# Patient Record
Sex: Male | Born: 1950
Health system: Southern US, Community
[De-identification: ages and names within clinical notes are randomized; demographics above are authoritative.]

## PROBLEM LIST (undated history)

## (undated) DIAGNOSIS — I779 Disorder of arteries and arterioles, unspecified: Secondary | ICD-10-CM

## (undated) DIAGNOSIS — E785 Hyperlipidemia, unspecified: Secondary | ICD-10-CM

## (undated) DIAGNOSIS — I739 Peripheral vascular disease, unspecified: Secondary | ICD-10-CM

## (undated) DIAGNOSIS — N189 Chronic kidney disease, unspecified: Secondary | ICD-10-CM

## (undated) DIAGNOSIS — I10 Essential (primary) hypertension: Secondary | ICD-10-CM

## (undated) HISTORY — PX: COLONOSCOPY: SHX174

## (undated) HISTORY — PX: WISDOM TOOTH EXTRACTION: SHX21

---

## 2007-08-17 ENCOUNTER — Ambulatory Visit: Payer: Self-pay

## 2008-11-21 ENCOUNTER — Emergency Department: Payer: Self-pay | Admitting: Emergency Medicine

## 2013-04-13 ENCOUNTER — Emergency Department: Payer: Self-pay | Admitting: Emergency Medicine

## 2014-01-19 DIAGNOSIS — E785 Hyperlipidemia, unspecified: Secondary | ICD-10-CM | POA: Insufficient documentation

## 2014-02-03 DIAGNOSIS — I739 Peripheral vascular disease, unspecified: Secondary | ICD-10-CM | POA: Insufficient documentation

## 2015-08-13 DIAGNOSIS — Z0000 Encounter for general adult medical examination without abnormal findings: Secondary | ICD-10-CM | POA: Insufficient documentation

## 2015-09-29 DIAGNOSIS — L819 Disorder of pigmentation, unspecified: Secondary | ICD-10-CM | POA: Diagnosis not present

## 2015-09-29 DIAGNOSIS — L820 Inflamed seborrheic keratosis: Secondary | ICD-10-CM | POA: Diagnosis not present

## 2015-09-29 DIAGNOSIS — D229 Melanocytic nevi, unspecified: Secondary | ICD-10-CM | POA: Diagnosis not present

## 2015-09-29 DIAGNOSIS — L814 Other melanin hyperpigmentation: Secondary | ICD-10-CM | POA: Diagnosis not present

## 2015-09-29 DIAGNOSIS — L282 Other prurigo: Secondary | ICD-10-CM | POA: Diagnosis not present

## 2016-02-04 DIAGNOSIS — Z0000 Encounter for general adult medical examination without abnormal findings: Secondary | ICD-10-CM | POA: Diagnosis not present

## 2016-02-04 DIAGNOSIS — I739 Peripheral vascular disease, unspecified: Secondary | ICD-10-CM | POA: Diagnosis not present

## 2016-02-11 DIAGNOSIS — E7800 Pure hypercholesterolemia, unspecified: Secondary | ICD-10-CM | POA: Diagnosis not present

## 2016-02-11 DIAGNOSIS — I739 Peripheral vascular disease, unspecified: Secondary | ICD-10-CM | POA: Diagnosis not present

## 2016-02-18 DIAGNOSIS — H43313 Vitreous membranes and strands, bilateral: Secondary | ICD-10-CM | POA: Diagnosis not present

## 2016-04-13 DIAGNOSIS — H43313 Vitreous membranes and strands, bilateral: Secondary | ICD-10-CM | POA: Diagnosis not present

## 2016-04-13 DIAGNOSIS — H2513 Age-related nuclear cataract, bilateral: Secondary | ICD-10-CM | POA: Diagnosis not present

## 2016-08-10 DIAGNOSIS — I739 Peripheral vascular disease, unspecified: Secondary | ICD-10-CM | POA: Diagnosis not present

## 2016-08-10 DIAGNOSIS — Z0000 Encounter for general adult medical examination without abnormal findings: Secondary | ICD-10-CM | POA: Diagnosis not present

## 2016-08-10 DIAGNOSIS — E7800 Pure hypercholesterolemia, unspecified: Secondary | ICD-10-CM | POA: Diagnosis not present

## 2016-08-17 DIAGNOSIS — E7800 Pure hypercholesterolemia, unspecified: Secondary | ICD-10-CM | POA: Diagnosis not present

## 2016-08-17 DIAGNOSIS — I739 Peripheral vascular disease, unspecified: Secondary | ICD-10-CM | POA: Diagnosis not present

## 2016-08-17 DIAGNOSIS — Z0000 Encounter for general adult medical examination without abnormal findings: Secondary | ICD-10-CM | POA: Diagnosis not present

## 2016-09-27 DIAGNOSIS — K219 Gastro-esophageal reflux disease without esophagitis: Secondary | ICD-10-CM | POA: Diagnosis not present

## 2016-09-27 DIAGNOSIS — R490 Dysphonia: Secondary | ICD-10-CM | POA: Diagnosis not present

## 2016-09-27 DIAGNOSIS — H6121 Impacted cerumen, right ear: Secondary | ICD-10-CM | POA: Diagnosis not present

## 2016-09-27 DIAGNOSIS — J301 Allergic rhinitis due to pollen: Secondary | ICD-10-CM | POA: Diagnosis not present

## 2016-12-26 ENCOUNTER — Ambulatory Visit
Admission: RE | Admit: 2016-12-26 | Discharge: 2016-12-26 | Disposition: A | Discharge status: Home / Self Care | Payer: PPO | Source: Ambulatory Visit | Attending: Chiropractor | Admitting: Chiropractor

## 2016-12-26 ENCOUNTER — Other Ambulatory Visit: Payer: Self-pay | Admitting: Chiropractor

## 2016-12-26 DIAGNOSIS — R200 Anesthesia of skin: Secondary | ICD-10-CM

## 2016-12-26 DIAGNOSIS — R202 Paresthesia of skin: Principal | ICD-10-CM

## 2016-12-26 DIAGNOSIS — M47892 Other spondylosis, cervical region: Secondary | ICD-10-CM | POA: Insufficient documentation

## 2016-12-26 DIAGNOSIS — M542 Cervicalgia: Secondary | ICD-10-CM | POA: Diagnosis not present

## 2016-12-28 ENCOUNTER — Other Ambulatory Visit: Payer: Self-pay | Admitting: Internal Medicine

## 2016-12-28 DIAGNOSIS — G542 Cervical root disorders, not elsewhere classified: Secondary | ICD-10-CM | POA: Diagnosis not present

## 2017-01-10 ENCOUNTER — Ambulatory Visit
Admission: RE | Admit: 2017-01-10 | Discharge: 2017-01-10 | Disposition: A | Discharge status: Home / Self Care | Payer: PPO | Source: Ambulatory Visit | Attending: Internal Medicine | Admitting: Internal Medicine

## 2017-01-10 DIAGNOSIS — M50322 Other cervical disc degeneration at C5-C6 level: Secondary | ICD-10-CM | POA: Diagnosis not present

## 2017-01-10 DIAGNOSIS — M4802 Spinal stenosis, cervical region: Secondary | ICD-10-CM | POA: Diagnosis not present

## 2017-01-10 DIAGNOSIS — M2578 Osteophyte, vertebrae: Secondary | ICD-10-CM | POA: Diagnosis not present

## 2017-01-10 DIAGNOSIS — G452 Multiple and bilateral precerebral artery syndromes: Secondary | ICD-10-CM | POA: Insufficient documentation

## 2017-01-10 DIAGNOSIS — M50323 Other cervical disc degeneration at C6-C7 level: Secondary | ICD-10-CM | POA: Diagnosis not present

## 2017-01-10 DIAGNOSIS — G542 Cervical root disorders, not elsewhere classified: Secondary | ICD-10-CM

## 2017-01-17 DIAGNOSIS — M5412 Radiculopathy, cervical region: Secondary | ICD-10-CM | POA: Diagnosis not present

## 2017-01-19 DIAGNOSIS — M5412 Radiculopathy, cervical region: Secondary | ICD-10-CM | POA: Diagnosis not present

## 2017-01-23 DIAGNOSIS — M5412 Radiculopathy, cervical region: Secondary | ICD-10-CM | POA: Diagnosis not present

## 2017-01-25 DIAGNOSIS — M5412 Radiculopathy, cervical region: Secondary | ICD-10-CM | POA: Diagnosis not present

## 2017-01-30 DIAGNOSIS — M5412 Radiculopathy, cervical region: Secondary | ICD-10-CM | POA: Diagnosis not present

## 2017-02-07 DIAGNOSIS — E7800 Pure hypercholesterolemia, unspecified: Secondary | ICD-10-CM | POA: Diagnosis not present

## 2017-02-07 DIAGNOSIS — I739 Peripheral vascular disease, unspecified: Secondary | ICD-10-CM | POA: Diagnosis not present

## 2017-02-07 DIAGNOSIS — Z0000 Encounter for general adult medical examination without abnormal findings: Secondary | ICD-10-CM | POA: Diagnosis not present

## 2017-02-14 DIAGNOSIS — I739 Peripheral vascular disease, unspecified: Secondary | ICD-10-CM | POA: Diagnosis not present

## 2017-02-14 DIAGNOSIS — E7800 Pure hypercholesterolemia, unspecified: Secondary | ICD-10-CM | POA: Diagnosis not present

## 2017-02-17 DIAGNOSIS — M5412 Radiculopathy, cervical region: Secondary | ICD-10-CM | POA: Diagnosis not present

## 2017-02-28 DIAGNOSIS — M5030 Other cervical disc degeneration, unspecified cervical region: Secondary | ICD-10-CM | POA: Diagnosis not present

## 2017-02-28 DIAGNOSIS — M4802 Spinal stenosis, cervical region: Secondary | ICD-10-CM | POA: Diagnosis not present

## 2017-02-28 DIAGNOSIS — M5412 Radiculopathy, cervical region: Secondary | ICD-10-CM | POA: Diagnosis not present

## 2017-03-06 DIAGNOSIS — Z1283 Encounter for screening for malignant neoplasm of skin: Secondary | ICD-10-CM | POA: Diagnosis not present

## 2017-03-06 DIAGNOSIS — D229 Melanocytic nevi, unspecified: Secondary | ICD-10-CM | POA: Diagnosis not present

## 2017-03-06 DIAGNOSIS — L603 Nail dystrophy: Secondary | ICD-10-CM | POA: Diagnosis not present

## 2017-03-06 DIAGNOSIS — L812 Freckles: Secondary | ICD-10-CM | POA: Diagnosis not present

## 2017-03-06 DIAGNOSIS — L821 Other seborrheic keratosis: Secondary | ICD-10-CM | POA: Diagnosis not present

## 2017-03-06 DIAGNOSIS — L578 Other skin changes due to chronic exposure to nonionizing radiation: Secondary | ICD-10-CM | POA: Diagnosis not present

## 2017-05-04 DIAGNOSIS — H43313 Vitreous membranes and strands, bilateral: Secondary | ICD-10-CM | POA: Diagnosis not present

## 2017-05-04 DIAGNOSIS — H2513 Age-related nuclear cataract, bilateral: Secondary | ICD-10-CM | POA: Diagnosis not present

## 2017-08-14 DIAGNOSIS — I739 Peripheral vascular disease, unspecified: Secondary | ICD-10-CM | POA: Diagnosis not present

## 2017-08-14 DIAGNOSIS — E7800 Pure hypercholesterolemia, unspecified: Secondary | ICD-10-CM | POA: Diagnosis not present

## 2017-08-21 DIAGNOSIS — I739 Peripheral vascular disease, unspecified: Secondary | ICD-10-CM | POA: Diagnosis not present

## 2017-08-21 DIAGNOSIS — Z0000 Encounter for general adult medical examination without abnormal findings: Secondary | ICD-10-CM | POA: Diagnosis not present

## 2017-08-21 DIAGNOSIS — E7800 Pure hypercholesterolemia, unspecified: Secondary | ICD-10-CM | POA: Diagnosis not present

## 2017-12-14 DIAGNOSIS — R1032 Left lower quadrant pain: Secondary | ICD-10-CM | POA: Diagnosis not present

## 2018-02-06 IMAGING — MR MR CERVICAL SPINE W/O CM
5 series · 32 of 48 positions shown · non-contrast
Comparison: Prior radiographs from 12/26/2016.

CLINICAL DATA: Initial evaluation for intermittent left arm
tingling for 3-4 months, pain and left scapula.

EXAM:
MRI CERVICAL SPINE WITHOUT CONTRAST
TECHNIQUE: Multiplanar, multisequence MR imaging of the cervical spine was
performed. No intravenous contrast was administered.

[Series 4: T2 · sagittal · 3.0mm · 0.70mm/px · 6 of 13 slices shown (1 of 2)]
[im 1/13]
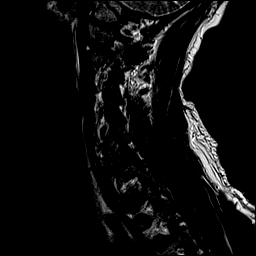
[im 3/13]
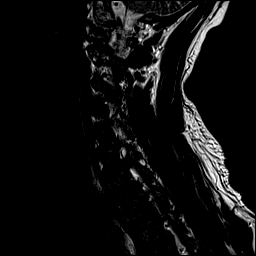
[im 5/13]
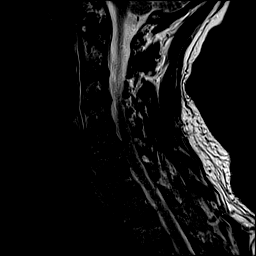
[im 8/13]
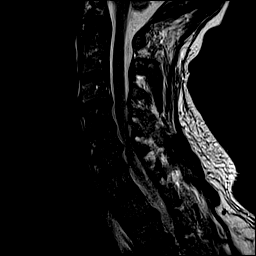
[im 10/13]
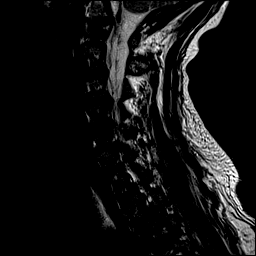
[im 13/13]
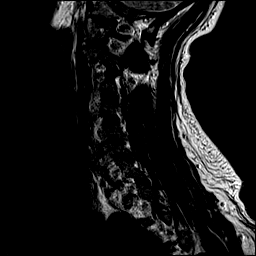

[Series 6: STIR · sagittal · 3.0mm · 0.35mm/px · 7 of 13 slices shown]
[im 1/13]
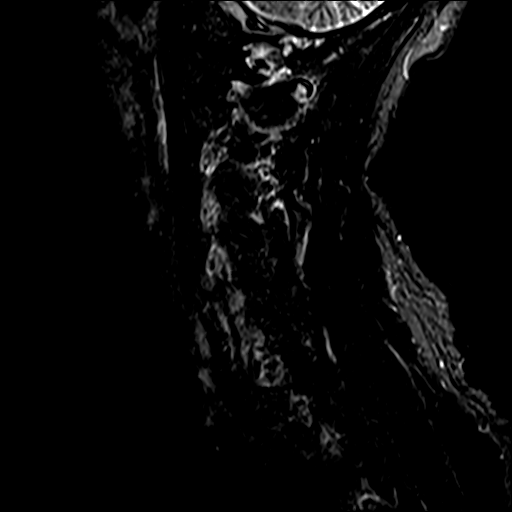
[im 3/13]
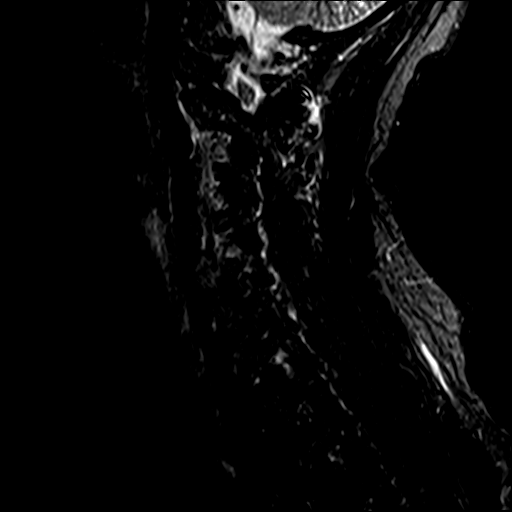
[im 5/13]
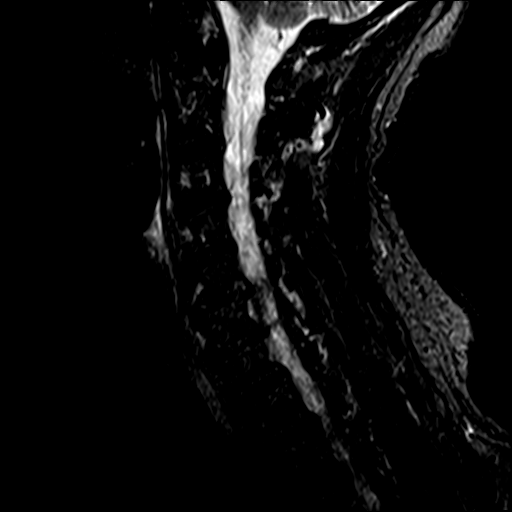
[im 7/13]
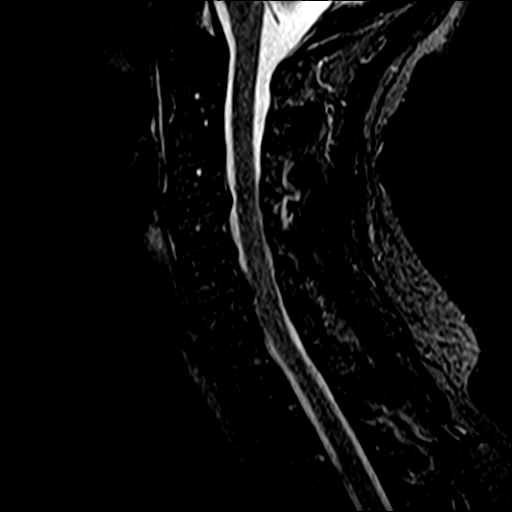
[im 9/13]
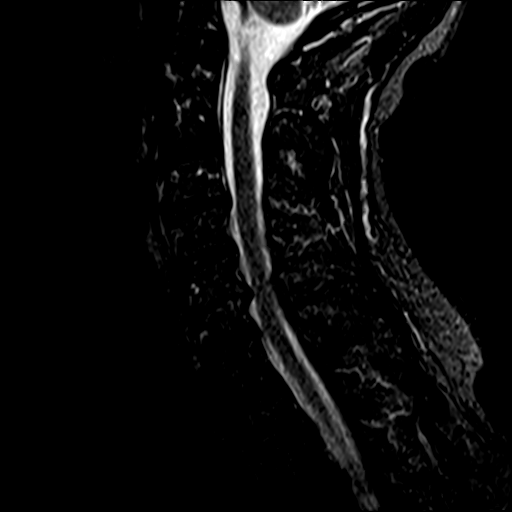
[im 11/13]
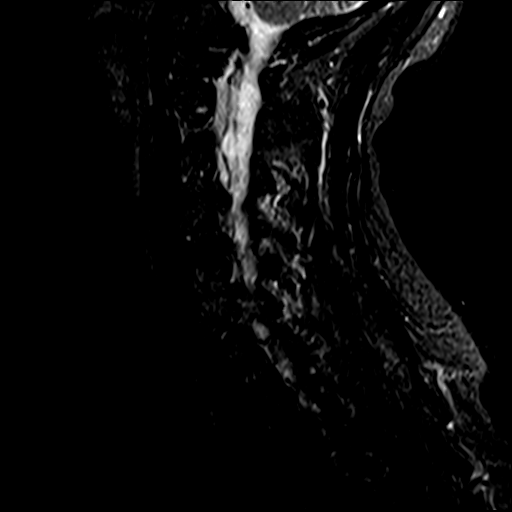
[im 13/13]
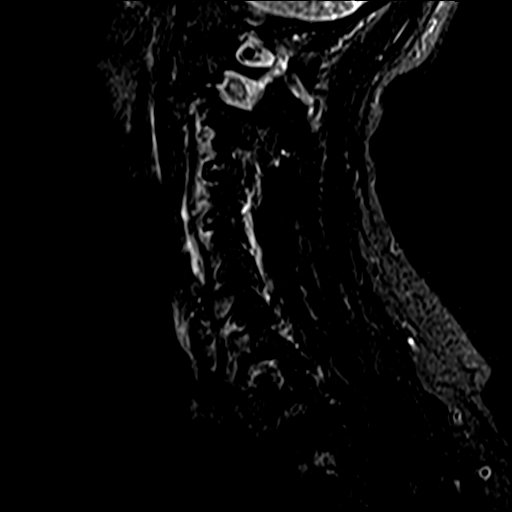

[Series 7: T2 · axial · 3.0mm · 0.70mm/px · z∈[-41,+51]mm · 8 of 26 slices shown (2 of 2)]
[im 1/26]
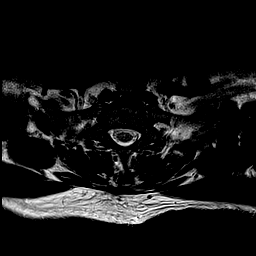
[im 4/26]
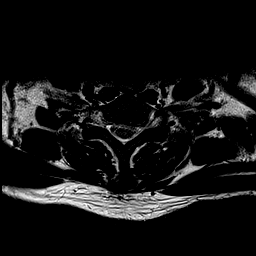
[im 8/26]
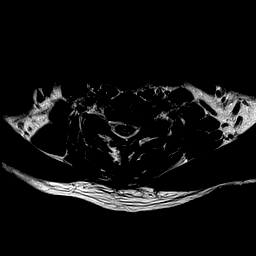
[im 12/26]
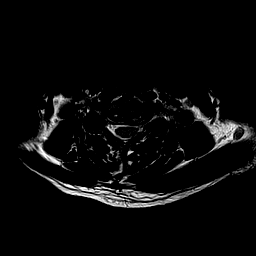
[im 14/26]
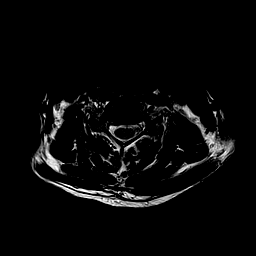
[im 18/26]
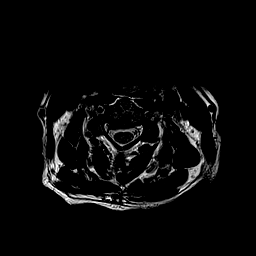
[im 22/26]
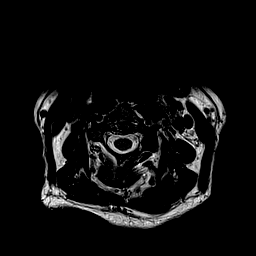
[im 26/26]
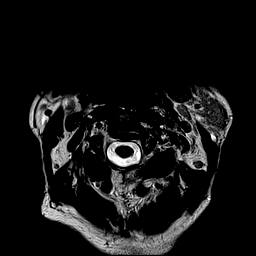

[Series 8: mpgr ax · axial · 3.0mm · 0.35mm/px · z∈[-41,-1]mm · 4 of 26 slices shown]
[im 1/26]
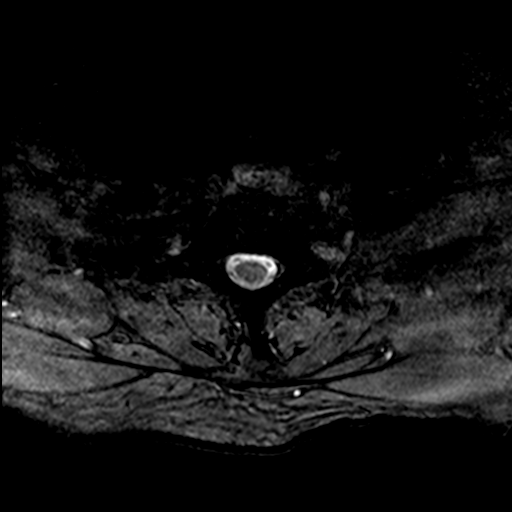
[im 4/26]
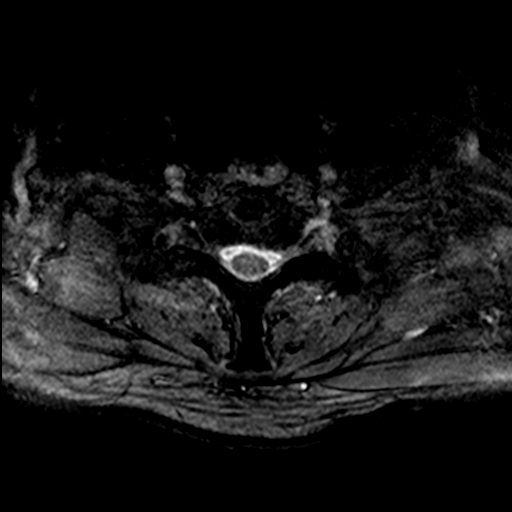
[im 8/26]
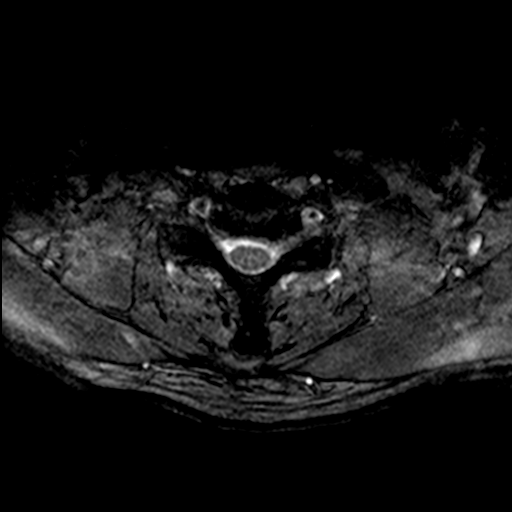
[im 12/26]
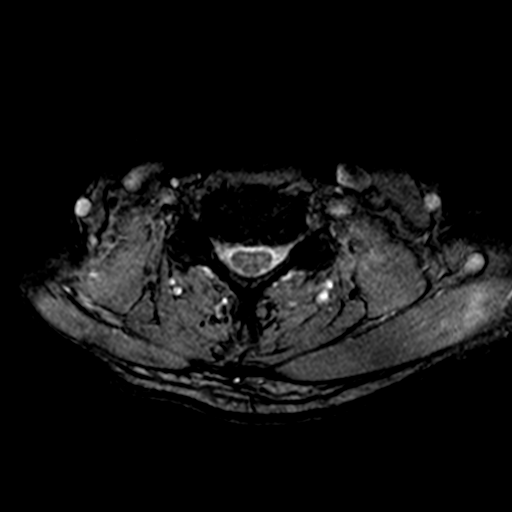

[Series 9: T1 · sagittal · 3.0mm · 0.70mm/px · 7 of 13 slices shown]
[im 1/13]
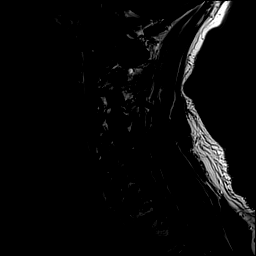
[im 3/13]
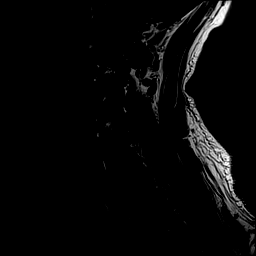
[im 5/13]
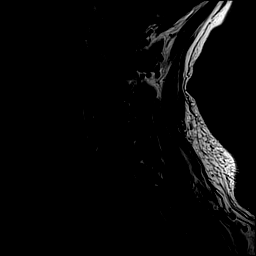
[im 7/13]
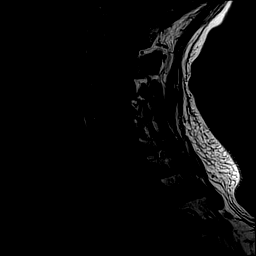
[im 9/13]
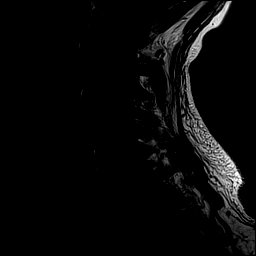
[im 11/13]
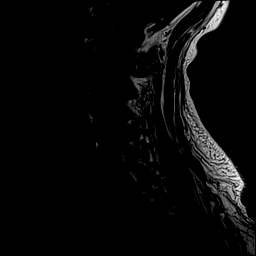
[im 13/13]
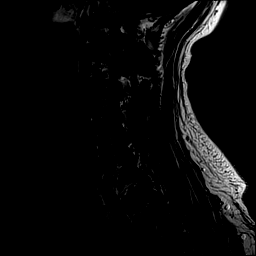

[32 of 48 positions shown; findings below may reference images not displayed]

FINDINGS: Alignment: Trace 2 mm retrolisthesis of C5 on C6. Vertebral bodies
otherwise normally aligned with preservation of the normal cervical
lordosis.

Vertebrae: Vertebral body heights are maintained. No evidence for
acute or chronic fracture. Signal intensity within the vertebral
body bone marrow is normal. No discrete or worrisome osseous
lesions. No abnormal marrow edema.

Cord: Signal intensity within the cervical spinal cord is normal.

Posterior Fossa, vertebral arteries, paraspinal tissues:
Craniocervical junction within normal limits. Paraspinous and
prevertebral soft tissues are normal. Normal intravascular flow
voids present within the vertebral arteries bilaterally.

Disc levels:

C2-C3: No significant disc bulge. Mild bilateral uncovertebral
hypertrophy. No significant canal or foraminal stenosis.

C3-C4: Diffuse degenerative disc bulge with bilateral uncovertebral
spurring. Mild bilateral facet hypertrophy. Broad posterior right
eccentric disc bulge flattens the ventral thecal sac. No significant
canal stenosis. Resultant moderate right with mild left C4 foraminal
stenosis.

C4-C5: Mild diffuse disc bulge. Right worse than left uncovertebral
spurring. Mild facet hypertrophy, greater on the left. Resultant
severe right with mild left C5 foraminal stenosis. Bulging disc
mildly flattens the ventral thecal sac without significant canal
stenosis.

C5-C6: Chronic diffuse degenerative disc osteophyte with
intervertebral disc space narrowing and uncovertebral spurring.
Broad posterior component flattens and partially faces the ventral
thecal sac. Mild cord flattening without cord signal changes.
Superimposed ligamentum flavum thickening. Resultant mild to
moderate canal stenosis. Severe bilateral C6 foraminal narrowing.

C6-C7: Chronic diffuse degenerative disc osteophyte with bilateral
uncovertebral spurring, worse on the left. Resultant more focal left
foraminal/far lateral disc osteophyte complex (series 7, image 19).
Superimposed mild facet hypertrophy. There is resultant severe left
C7 foraminal stenosis. Broad posterior disc osteophyte, slightly
centric to the left, flattens the ventral thecal sac without
significant canal stenosis. Mild right C7 foraminal narrowing.

C7-T1: Mild disc bulge with uncovertebral hypertrophy. Resultant
mild left C8 foraminal stenosis. No significant canal or right
foraminal narrowing.

Visualized upper thoracic spine unremarkable without significant
degenerative changes.
IMPRESSION: 1. Left-sided disc osteophyte complex at C6-7 with resultant severe
left C7 foraminal stenosis.
2. Chronic diffuse degenerative disc osteophyte at C5-6 with
resultant moderate canal and severe bilateral C6 foraminal stenosis.
3. Right-sided uncovertebral disease at C3-4 and C4-5 with resultant
moderate right C4 and severe right C5 foraminal stenosis.

## 2018-02-26 DIAGNOSIS — M25561 Pain in right knee: Secondary | ICD-10-CM | POA: Diagnosis not present

## 2018-03-07 DIAGNOSIS — D239 Other benign neoplasm of skin, unspecified: Secondary | ICD-10-CM

## 2018-03-07 DIAGNOSIS — D485 Neoplasm of uncertain behavior of skin: Secondary | ICD-10-CM | POA: Diagnosis not present

## 2018-03-07 DIAGNOSIS — L821 Other seborrheic keratosis: Secondary | ICD-10-CM | POA: Diagnosis not present

## 2018-03-07 DIAGNOSIS — L578 Other skin changes due to chronic exposure to nonionizing radiation: Secondary | ICD-10-CM | POA: Diagnosis not present

## 2018-03-07 DIAGNOSIS — D225 Melanocytic nevi of trunk: Secondary | ICD-10-CM | POA: Diagnosis not present

## 2018-03-07 DIAGNOSIS — Z1283 Encounter for screening for malignant neoplasm of skin: Secondary | ICD-10-CM | POA: Diagnosis not present

## 2018-03-07 DIAGNOSIS — L812 Freckles: Secondary | ICD-10-CM | POA: Diagnosis not present

## 2018-03-07 DIAGNOSIS — D2262 Melanocytic nevi of left upper limb, including shoulder: Secondary | ICD-10-CM | POA: Diagnosis not present

## 2018-03-07 HISTORY — DX: Other benign neoplasm of skin, unspecified: D23.9

## 2018-05-08 DIAGNOSIS — H43313 Vitreous membranes and strands, bilateral: Secondary | ICD-10-CM | POA: Diagnosis not present

## 2018-05-08 DIAGNOSIS — H2513 Age-related nuclear cataract, bilateral: Secondary | ICD-10-CM | POA: Diagnosis not present

## 2018-05-30 DIAGNOSIS — L0100 Impetigo, unspecified: Secondary | ICD-10-CM | POA: Diagnosis not present

## 2018-05-30 DIAGNOSIS — D2262 Melanocytic nevi of left upper limb, including shoulder: Secondary | ICD-10-CM | POA: Diagnosis not present

## 2018-05-30 DIAGNOSIS — L821 Other seborrheic keratosis: Secondary | ICD-10-CM | POA: Diagnosis not present

## 2018-05-30 DIAGNOSIS — R234 Changes in skin texture: Secondary | ICD-10-CM | POA: Diagnosis not present

## 2018-08-16 DIAGNOSIS — Z125 Encounter for screening for malignant neoplasm of prostate: Secondary | ICD-10-CM | POA: Diagnosis not present

## 2018-08-16 DIAGNOSIS — Z139 Encounter for screening, unspecified: Secondary | ICD-10-CM | POA: Diagnosis not present

## 2018-08-16 DIAGNOSIS — E7800 Pure hypercholesterolemia, unspecified: Secondary | ICD-10-CM | POA: Diagnosis not present

## 2018-08-23 DIAGNOSIS — N183 Chronic kidney disease, stage 3 unspecified: Secondary | ICD-10-CM | POA: Insufficient documentation

## 2018-08-23 DIAGNOSIS — I739 Peripheral vascular disease, unspecified: Secondary | ICD-10-CM | POA: Diagnosis not present

## 2018-08-23 DIAGNOSIS — E7800 Pure hypercholesterolemia, unspecified: Secondary | ICD-10-CM | POA: Diagnosis not present

## 2018-08-23 DIAGNOSIS — Z0000 Encounter for general adult medical examination without abnormal findings: Secondary | ICD-10-CM | POA: Diagnosis not present

## 2018-09-26 ENCOUNTER — Ambulatory Visit (INDEPENDENT_AMBULATORY_CARE_PROVIDER_SITE_OTHER): Payer: PPO

## 2018-09-26 ENCOUNTER — Ambulatory Visit: Payer: PPO | Admitting: Podiatry

## 2018-09-26 ENCOUNTER — Other Ambulatory Visit: Payer: Self-pay | Admitting: Podiatry

## 2018-09-26 ENCOUNTER — Encounter: Payer: Self-pay | Admitting: Podiatry

## 2018-09-26 DIAGNOSIS — M722 Plantar fascial fibromatosis: Secondary | ICD-10-CM

## 2018-09-26 DIAGNOSIS — L608 Other nail disorders: Secondary | ICD-10-CM | POA: Diagnosis not present

## 2018-09-26 DIAGNOSIS — L603 Nail dystrophy: Secondary | ICD-10-CM | POA: Diagnosis not present

## 2018-09-26 DIAGNOSIS — M779 Enthesopathy, unspecified: Principal | ICD-10-CM

## 2018-09-26 DIAGNOSIS — M778 Other enthesopathies, not elsewhere classified: Secondary | ICD-10-CM

## 2018-09-26 NOTE — Progress Notes (Signed)
  Subjective:  Patient ID: Jordan Leach., male    DOB: [birth year],  MRN: [id number] HPI Chief Complaint  Patient presents with  . Foot Pain    Plantar and posterior heel bilateral (R>L) - aching x couple weeks, AM pain, walks 3 miles/day, tried stretching-some better  . Nail Problem    Concerned about discoloration of toenails especially hallux nails  . New Patient (Initial Visit)    68 y.o. male presents with the above complaint.   ROS: He denies fever chills nausea vomiting muscle aches pains calf pain back pain chest pain shortness of breath.  No past medical history on file.   Current Outpatient Medications:  .  clopidogrel (PLAVIX) 75 MG tablet, TAKE ONE TABLET BY MOUTH ONCE A DAY, Disp: , Rfl:  .  pravastatin (PRAVACHOL) 40 MG tablet, TAKE 1 TABLET BY MOUTH ONCE A DAY, Disp: , Rfl:  .  pantoprazole (PROTONIX) 40 MG tablet, , Disp: , Rfl:   Allergies  Allergen Reactions  . Aspirin     Other reaction(s): Angioedema  . Sulfa Antibiotics Rash   Review of Systems Objective:  There were no vitals filed for this visit.  General: Well developed, nourished, in no acute distress, alert and oriented x3   Dermatological: Skin is warm, dry and supple bilateral. Nails x 10 are well maintained; remaining integument appears unremarkable at this time. There are no open sores, no preulcerative lesions, no rash or signs of infection present.  Thick yellow dystrophic possibly mycotic nail hallux right lesser degree hallux left  Vascular: Dorsalis Pedis artery and Posterior Tibial artery pedal pulses are 2/4 bilateral with immedate capillary fill time. Pedal hair growth present. No varicosities and no lower extremity edema present bilateral.   Neruologic: Grossly intact via light touch bilateral. Vibratory intact via tuning fork bilateral. Protective threshold with Semmes Wienstein monofilament intact to all pedal sites bilateral. Patellar and Achilles deep tendon reflexes 2+  bilateral. No Babinski or clonus noted bilateral.   Musculoskeletal: No gross boney pedal deformities bilateral. No pain, crepitus, or limitation noted with foot and ankle range of motion bilateral. Muscular strength 5/5 in all groups tested bilateral.  Pain on palpation medial calcaneal tubercle of the right heel.  Gait: Unassisted, Nonantalgic.    Radiographs:  Radiographs demonstrate soft tissue increase in density plantar fashion calcaneal insertion site of the right heel minimally so on the left heel Achilles appears to be normal no large spurs noted.  No acute findings.  Assessment & Plan:   Assessment: Plantar fasciitis right heel.  Nail dystrophy hallux right greater than left.  Plan: Discussed etiology pathology conservative surgical therapies at this point I injected the right heel today with 20 mg Kenalog 5 mg Marcaine point of maximal tenderness.  Discussed appropriate shoe gear stretching exercise ice therapy sugar modifications.  Samples of the nails were taken today to be sent for pathologic evaluation follow-up with him in 1 month     Max T. Firth, [place]

## 2018-10-03 DIAGNOSIS — L820 Inflamed seborrheic keratosis: Secondary | ICD-10-CM | POA: Diagnosis not present

## 2018-10-03 DIAGNOSIS — L821 Other seborrheic keratosis: Secondary | ICD-10-CM | POA: Diagnosis not present

## 2018-10-24 ENCOUNTER — Ambulatory Visit: Payer: PPO | Admitting: Podiatry

## 2018-10-24 ENCOUNTER — Encounter: Payer: Self-pay | Admitting: Podiatry

## 2018-10-24 ENCOUNTER — Other Ambulatory Visit: Payer: Self-pay

## 2018-10-24 DIAGNOSIS — L603 Nail dystrophy: Secondary | ICD-10-CM | POA: Diagnosis not present

## 2018-10-24 NOTE — Progress Notes (Signed)
Presents today for follow-up of her right heel pain.  States that had no pain anymore.  He is also here for his [place] results.  Objective: Vital signs stable alert and oriented x3 pathology report does demonstrate nail dystrophy.  He also has no reproducible pain on palpation medial [place] tubercle of the right heel.  Assessment 100% resolution of plantar fasciitis.  Nail dystrophy left foot.  Plan: Follow-up with me on as-needed basis.

## 2018-10-29 DIAGNOSIS — M5410 Radiculopathy, site unspecified: Secondary | ICD-10-CM | POA: Diagnosis not present

## 2018-10-29 DIAGNOSIS — F419 Anxiety disorder, unspecified: Secondary | ICD-10-CM | POA: Diagnosis not present

## 2018-10-29 DIAGNOSIS — H6122 Impacted cerumen, left ear: Secondary | ICD-10-CM | POA: Diagnosis not present

## 2018-12-24 DIAGNOSIS — H6062 Unspecified chronic otitis externa, left ear: Secondary | ICD-10-CM | POA: Diagnosis not present

## 2018-12-24 DIAGNOSIS — H6123 Impacted cerumen, bilateral: Secondary | ICD-10-CM | POA: Diagnosis not present

## 2018-12-28 DIAGNOSIS — R030 Elevated blood-pressure reading, without diagnosis of hypertension: Secondary | ICD-10-CM | POA: Diagnosis not present

## 2018-12-28 DIAGNOSIS — N183 Chronic kidney disease, stage 3 (moderate): Secondary | ICD-10-CM | POA: Diagnosis not present

## 2018-12-28 DIAGNOSIS — M5030 Other cervical disc degeneration, unspecified cervical region: Secondary | ICD-10-CM | POA: Diagnosis not present

## 2018-12-28 DIAGNOSIS — M5412 Radiculopathy, cervical region: Secondary | ICD-10-CM | POA: Diagnosis not present

## 2018-12-28 DIAGNOSIS — E785 Hyperlipidemia, unspecified: Secondary | ICD-10-CM | POA: Diagnosis not present

## 2019-01-01 DIAGNOSIS — R030 Elevated blood-pressure reading, without diagnosis of hypertension: Secondary | ICD-10-CM | POA: Diagnosis not present

## 2019-01-03 DIAGNOSIS — M50222 Other cervical disc displacement at C5-C6 level: Secondary | ICD-10-CM | POA: Diagnosis not present

## 2019-01-03 DIAGNOSIS — M531 Cervicobrachial syndrome: Secondary | ICD-10-CM | POA: Diagnosis not present

## 2019-01-03 DIAGNOSIS — M9901 Segmental and somatic dysfunction of cervical region: Secondary | ICD-10-CM | POA: Diagnosis not present

## 2019-01-08 DIAGNOSIS — N183 Chronic kidney disease, stage 3 (moderate): Secondary | ICD-10-CM | POA: Diagnosis not present

## 2019-01-08 DIAGNOSIS — M50222 Other cervical disc displacement at C5-C6 level: Secondary | ICD-10-CM | POA: Diagnosis not present

## 2019-01-08 DIAGNOSIS — R030 Elevated blood-pressure reading, without diagnosis of hypertension: Secondary | ICD-10-CM | POA: Diagnosis not present

## 2019-01-08 DIAGNOSIS — M9901 Segmental and somatic dysfunction of cervical region: Secondary | ICD-10-CM | POA: Diagnosis not present

## 2019-01-08 DIAGNOSIS — I739 Peripheral vascular disease, unspecified: Secondary | ICD-10-CM | POA: Diagnosis not present

## 2019-01-08 DIAGNOSIS — E7800 Pure hypercholesterolemia, unspecified: Secondary | ICD-10-CM | POA: Diagnosis not present

## 2019-01-08 DIAGNOSIS — M531 Cervicobrachial syndrome: Secondary | ICD-10-CM | POA: Diagnosis not present

## 2019-01-10 DIAGNOSIS — M50222 Other cervical disc displacement at C5-C6 level: Secondary | ICD-10-CM | POA: Diagnosis not present

## 2019-01-10 DIAGNOSIS — M9901 Segmental and somatic dysfunction of cervical region: Secondary | ICD-10-CM | POA: Diagnosis not present

## 2019-01-10 DIAGNOSIS — M531 Cervicobrachial syndrome: Secondary | ICD-10-CM | POA: Diagnosis not present

## 2019-01-14 DIAGNOSIS — M9901 Segmental and somatic dysfunction of cervical region: Secondary | ICD-10-CM | POA: Diagnosis not present

## 2019-01-14 DIAGNOSIS — M531 Cervicobrachial syndrome: Secondary | ICD-10-CM | POA: Diagnosis not present

## 2019-01-14 DIAGNOSIS — M50222 Other cervical disc displacement at C5-C6 level: Secondary | ICD-10-CM | POA: Diagnosis not present

## 2019-01-15 DIAGNOSIS — N183 Chronic kidney disease, stage 3 (moderate): Secondary | ICD-10-CM | POA: Diagnosis not present

## 2019-01-15 DIAGNOSIS — E7800 Pure hypercholesterolemia, unspecified: Secondary | ICD-10-CM | POA: Diagnosis not present

## 2019-01-15 DIAGNOSIS — I739 Peripheral vascular disease, unspecified: Secondary | ICD-10-CM | POA: Diagnosis not present

## 2019-01-17 DIAGNOSIS — M50222 Other cervical disc displacement at C5-C6 level: Secondary | ICD-10-CM | POA: Diagnosis not present

## 2019-01-17 DIAGNOSIS — M9901 Segmental and somatic dysfunction of cervical region: Secondary | ICD-10-CM | POA: Diagnosis not present

## 2019-01-17 DIAGNOSIS — M531 Cervicobrachial syndrome: Secondary | ICD-10-CM | POA: Diagnosis not present

## 2019-01-22 DIAGNOSIS — M50222 Other cervical disc displacement at C5-C6 level: Secondary | ICD-10-CM | POA: Diagnosis not present

## 2019-01-22 DIAGNOSIS — M531 Cervicobrachial syndrome: Secondary | ICD-10-CM | POA: Diagnosis not present

## 2019-01-22 DIAGNOSIS — M9901 Segmental and somatic dysfunction of cervical region: Secondary | ICD-10-CM | POA: Diagnosis not present

## 2019-01-30 DIAGNOSIS — I10 Essential (primary) hypertension: Secondary | ICD-10-CM | POA: Insufficient documentation

## 2019-03-01 DIAGNOSIS — I10 Essential (primary) hypertension: Secondary | ICD-10-CM | POA: Diagnosis not present

## 2019-03-01 DIAGNOSIS — N183 Chronic kidney disease, stage 3 (moderate): Secondary | ICD-10-CM | POA: Diagnosis not present

## 2019-03-12 DIAGNOSIS — E7800 Pure hypercholesterolemia, unspecified: Secondary | ICD-10-CM | POA: Diagnosis not present

## 2019-03-12 DIAGNOSIS — I739 Peripheral vascular disease, unspecified: Secondary | ICD-10-CM | POA: Diagnosis not present

## 2019-03-12 DIAGNOSIS — N183 Chronic kidney disease, stage 3 (moderate): Secondary | ICD-10-CM | POA: Diagnosis not present

## 2019-03-13 DIAGNOSIS — G4733 Obstructive sleep apnea (adult) (pediatric): Secondary | ICD-10-CM | POA: Diagnosis not present

## 2019-03-18 DIAGNOSIS — D2260 Melanocytic nevi of unspecified upper limb, including shoulder: Secondary | ICD-10-CM | POA: Diagnosis not present

## 2019-03-18 DIAGNOSIS — L578 Other skin changes due to chronic exposure to nonionizing radiation: Secondary | ICD-10-CM | POA: Diagnosis not present

## 2019-03-18 DIAGNOSIS — D2270 Melanocytic nevi of unspecified lower limb, including hip: Secondary | ICD-10-CM | POA: Diagnosis not present

## 2019-03-18 DIAGNOSIS — Z1283 Encounter for screening for malignant neoplasm of skin: Secondary | ICD-10-CM | POA: Diagnosis not present

## 2019-03-18 DIAGNOSIS — L814 Other melanin hyperpigmentation: Secondary | ICD-10-CM | POA: Diagnosis not present

## 2019-03-18 DIAGNOSIS — L821 Other seborrheic keratosis: Secondary | ICD-10-CM | POA: Diagnosis not present

## 2019-03-18 DIAGNOSIS — D2230 Melanocytic nevi of unspecified part of face: Secondary | ICD-10-CM | POA: Diagnosis not present

## 2019-03-18 DIAGNOSIS — D225 Melanocytic nevi of trunk: Secondary | ICD-10-CM | POA: Diagnosis not present

## 2019-03-18 DIAGNOSIS — R234 Changes in skin texture: Secondary | ICD-10-CM | POA: Diagnosis not present

## 2019-03-18 DIAGNOSIS — D1800 Hemangioma unspecified site: Secondary | ICD-10-CM | POA: Diagnosis not present

## 2019-06-04 DIAGNOSIS — Z125 Encounter for screening for malignant neoplasm of prostate: Secondary | ICD-10-CM | POA: Diagnosis not present

## 2019-06-04 DIAGNOSIS — I739 Peripheral vascular disease, unspecified: Secondary | ICD-10-CM | POA: Diagnosis not present

## 2019-06-04 DIAGNOSIS — H6123 Impacted cerumen, bilateral: Secondary | ICD-10-CM | POA: Diagnosis not present

## 2019-06-04 DIAGNOSIS — E7800 Pure hypercholesterolemia, unspecified: Secondary | ICD-10-CM | POA: Diagnosis not present

## 2019-06-04 DIAGNOSIS — N1831 Chronic kidney disease, stage 3a: Secondary | ICD-10-CM | POA: Diagnosis not present

## 2019-06-04 DIAGNOSIS — H6061 Unspecified chronic otitis externa, right ear: Secondary | ICD-10-CM | POA: Diagnosis not present

## 2019-06-04 DIAGNOSIS — I10 Essential (primary) hypertension: Secondary | ICD-10-CM | POA: Diagnosis not present

## 2019-06-14 DIAGNOSIS — Z20828 Contact with and (suspected) exposure to other viral communicable diseases: Secondary | ICD-10-CM | POA: Diagnosis not present

## 2019-08-28 DIAGNOSIS — Z125 Encounter for screening for malignant neoplasm of prostate: Secondary | ICD-10-CM | POA: Diagnosis not present

## 2019-08-28 DIAGNOSIS — E7800 Pure hypercholesterolemia, unspecified: Secondary | ICD-10-CM | POA: Diagnosis not present

## 2019-08-28 DIAGNOSIS — N1831 Chronic kidney disease, stage 3a: Secondary | ICD-10-CM | POA: Diagnosis not present

## 2019-08-28 DIAGNOSIS — I10 Essential (primary) hypertension: Secondary | ICD-10-CM | POA: Diagnosis not present

## 2019-09-04 DIAGNOSIS — N1831 Chronic kidney disease, stage 3a: Secondary | ICD-10-CM | POA: Diagnosis not present

## 2019-09-04 DIAGNOSIS — I10 Essential (primary) hypertension: Secondary | ICD-10-CM | POA: Diagnosis not present

## 2019-09-04 DIAGNOSIS — Z0000 Encounter for general adult medical examination without abnormal findings: Secondary | ICD-10-CM | POA: Diagnosis not present

## 2019-09-04 DIAGNOSIS — E7800 Pure hypercholesterolemia, unspecified: Secondary | ICD-10-CM | POA: Diagnosis not present

## 2019-09-04 DIAGNOSIS — I739 Peripheral vascular disease, unspecified: Secondary | ICD-10-CM | POA: Diagnosis not present

## 2019-09-27 DIAGNOSIS — L820 Inflamed seborrheic keratosis: Secondary | ICD-10-CM | POA: Diagnosis not present

## 2019-09-27 DIAGNOSIS — R21 Rash and other nonspecific skin eruption: Secondary | ICD-10-CM | POA: Diagnosis not present

## 2019-09-27 DIAGNOSIS — L249 Irritant contact dermatitis, unspecified cause: Secondary | ICD-10-CM | POA: Diagnosis not present

## 2019-10-21 DIAGNOSIS — D485 Neoplasm of uncertain behavior of skin: Secondary | ICD-10-CM | POA: Diagnosis not present

## 2019-10-21 DIAGNOSIS — L578 Other skin changes due to chronic exposure to nonionizing radiation: Secondary | ICD-10-CM | POA: Diagnosis not present

## 2019-10-21 DIAGNOSIS — L570 Actinic keratosis: Secondary | ICD-10-CM

## 2019-10-21 HISTORY — DX: Actinic keratosis: L57.0

## 2019-11-22 DIAGNOSIS — H61031 Chondritis of right external ear: Secondary | ICD-10-CM | POA: Diagnosis not present

## 2019-11-22 DIAGNOSIS — H6123 Impacted cerumen, bilateral: Secondary | ICD-10-CM | POA: Diagnosis not present

## 2019-11-22 DIAGNOSIS — H6063 Unspecified chronic otitis externa, bilateral: Secondary | ICD-10-CM | POA: Diagnosis not present

## 2019-12-03 ENCOUNTER — Ambulatory Visit: Payer: PPO | Admitting: Dermatology

## 2019-12-03 ENCOUNTER — Other Ambulatory Visit: Payer: Self-pay

## 2019-12-03 DIAGNOSIS — L570 Actinic keratosis: Secondary | ICD-10-CM | POA: Diagnosis not present

## 2019-12-03 DIAGNOSIS — L578 Other skin changes due to chronic exposure to nonionizing radiation: Secondary | ICD-10-CM

## 2019-12-03 DIAGNOSIS — H61002 Unspecified perichondritis of left external ear: Secondary | ICD-10-CM

## 2019-12-03 DIAGNOSIS — L821 Other seborrheic keratosis: Secondary | ICD-10-CM | POA: Diagnosis not present

## 2019-12-03 DIAGNOSIS — H61001 Unspecified perichondritis of right external ear: Secondary | ICD-10-CM

## 2019-12-03 DIAGNOSIS — D229 Melanocytic nevi, unspecified: Secondary | ICD-10-CM | POA: Diagnosis not present

## 2019-12-03 DIAGNOSIS — I781 Nevus, non-neoplastic: Secondary | ICD-10-CM

## 2019-12-03 NOTE — Progress Notes (Signed)
   Follow-Up Visit   Subjective  Jordan Leach. is a 69 y.o. male who presents for the following: Actinic Keratosis (bx proven - above the R earlobe adjacent to the sup anti tragus, patient is here today for treatment), check spot (L ear), and check moles (back). He also has a spot on the right ear that he would like checked that the ENT felt was [facility]  The following portions of the chart were reviewed this encounter and updated as appropriate: Tobacco  Allergies  Meds  Problems  Med Hx  Surg Hx  Fam Hx     Review of Systems: No other skin or systemic complaints.  Objective  Well appearing patient in no apparent distress; mood and affect are within normal limits.  A focused examination was performed including the face. Relevant physical exam findings are noted in the Assessment and Plan.  Objective  above the right earlobe adjacent to the sup anti tragus: Erythematous thin papules/macules with gritty scale.   Objective  R helix: Scaly pink pap  Objective  L concha: Telangiectatic patch  Assessment & Plan  AK (actinic keratosis) above the right earlobe adjacent to the sup anti tragus  Bx proven -   Destruction of lesion - above the right earlobe adjacent to the sup anti tragus Complexity: simple   Destruction method: cryotherapy   Informed consent: discussed and consent obtained   Timeout:  patient name, date of birth, surgical site, and procedure verified Lesion destroyed using liquid nitrogen: Yes   Region frozen until ice ball extended beyond lesion: Yes   Outcome: patient tolerated procedure well with no complications   Post-procedure details: wound care instructions given    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 R helix  Destruction of lesion - R helix Complexity: simple   Destruction method: cryotherapy   Informed consent: discussed and consent obtained   Timeout:  patient name, date of birth, surgical site, and procedure  verified Lesion destroyed using liquid nitrogen: Yes   Region frozen until ice ball extended beyond lesion: Yes   Outcome: patient tolerated procedure well with no complications   Post-procedure details: wound care instructions given    Telangiectasia L concha  Benign, observe   Actinic Damage - diffuse scaly erythematous macules with underlying dyspigmentation - Recommend daily broad spectrum sunscreen SPF 30+ to sun-exposed areas, reapply every 2 hours as needed.  - Call for new or changing lesions. Melanocytic Nevi - Tan-brown and/or pink-flesh-colored symmetric macules and papules - Benign appearing on exam today - Observation - Call clinic for new or changing moles - Recommend daily use of broad spectrum spf 30+ sunscreen to sun-exposed areas.   Seborrheic Keratoses - Stuck-on, waxy, tan-brown papules and plaques  - Discussed benign etiology and prognosis. - Observe - Call for any changes       Return in 3 months (on 03/03/2020) for recheck AK and CNCH R ear, TBSE.   Luther Redo, CMA, am acting as scribe for Sarina Ser, MD . I, Othelia Pulling, RMA, am acting as scribe for Sarina Ser, MD .  Documentation: I have reviewed the above documentation for accuracy and completeness, and I agree with the above.  Sarina Ser, MD

## 2019-12-04 ENCOUNTER — Encounter: Payer: Self-pay | Admitting: Dermatology

## 2020-02-25 DIAGNOSIS — E7800 Pure hypercholesterolemia, unspecified: Secondary | ICD-10-CM | POA: Diagnosis not present

## 2020-02-25 DIAGNOSIS — I10 Essential (primary) hypertension: Secondary | ICD-10-CM | POA: Diagnosis not present

## 2020-02-25 DIAGNOSIS — N1831 Chronic kidney disease, stage 3a: Secondary | ICD-10-CM | POA: Diagnosis not present

## 2020-03-03 DIAGNOSIS — E7800 Pure hypercholesterolemia, unspecified: Secondary | ICD-10-CM | POA: Diagnosis not present

## 2020-03-03 DIAGNOSIS — N1831 Chronic kidney disease, stage 3a: Secondary | ICD-10-CM | POA: Diagnosis not present

## 2020-03-03 DIAGNOSIS — I10 Essential (primary) hypertension: Secondary | ICD-10-CM | POA: Diagnosis not present

## 2020-03-04 ENCOUNTER — Other Ambulatory Visit: Payer: Self-pay

## 2020-03-04 ENCOUNTER — Ambulatory Visit: Payer: PPO | Admitting: Dermatology

## 2020-03-04 DIAGNOSIS — L821 Other seborrheic keratosis: Secondary | ICD-10-CM

## 2020-03-04 DIAGNOSIS — D1800 Hemangioma unspecified site: Secondary | ICD-10-CM | POA: Diagnosis not present

## 2020-03-04 DIAGNOSIS — L570 Actinic keratosis: Secondary | ICD-10-CM

## 2020-03-04 DIAGNOSIS — D485 Neoplasm of uncertain behavior of skin: Secondary | ICD-10-CM

## 2020-03-04 DIAGNOSIS — L814 Other melanin hyperpigmentation: Secondary | ICD-10-CM

## 2020-03-04 DIAGNOSIS — L739 Follicular disorder, unspecified: Secondary | ICD-10-CM | POA: Diagnosis not present

## 2020-03-04 DIAGNOSIS — L578 Other skin changes due to chronic exposure to nonionizing radiation: Secondary | ICD-10-CM

## 2020-03-04 DIAGNOSIS — D229 Melanocytic nevi, unspecified: Secondary | ICD-10-CM

## 2020-03-04 DIAGNOSIS — Z1283 Encounter for screening for malignant neoplasm of skin: Secondary | ICD-10-CM | POA: Diagnosis not present

## 2020-03-04 DIAGNOSIS — H61001 Unspecified perichondritis of right external ear: Secondary | ICD-10-CM

## 2020-03-04 NOTE — Patient Instructions (Signed)

## 2020-03-04 NOTE — Progress Notes (Signed)
Follow-Up Visit   Subjective  Jordan Leach. is a 69 y.o. male who presents for the following: Annual Exam (recheck AK's of the ears ). The patient presents for Total-Body Skin Exam (TBSE) for skin cancer screening and mole check.  The following portions of the chart were reviewed this encounter and updated as appropriate:  Tobacco  Allergies  Meds  Problems  Med Hx  Surg Hx  Fam Hx     Review of Systems:  No other skin or systemic complaints except as noted in HPI or Assessment and Plan.  Objective  Well appearing patient in no apparent distress; mood and affect are within normal limits.  A full examination was performed including scalp, head, eyes, ears, nose, lips, neck, chest, axillae, abdomen, back, buttocks, bilateral upper extremities, bilateral lower extremities, hands, feet, fingers, toes, fingernails, and toenails. All findings within normal limits unless otherwise noted below.  Objective  R ear lobe: 0.8 cm scaly pink patch      Objective  above the right earlobe adjacent to the sup anti tragus: Clear   Objective  R mid to inf anti helix: Erythema and scale  Images    Assessment & Plan    Neoplasm of uncertain behavior of skin R ear lobe  Skin / nail biopsy Type of biopsy: tangential   Informed consent: discussed and consent obtained   Timeout: patient name, date of birth, surgical site, and procedure verified   Procedure prep:  Patient was prepped and draped in usual sterile fashion Prep type:  Isopropyl alcohol Anesthesia: the lesion was anesthetized in a standard fashion   Anesthetic:  1% lidocaine w/ epinephrine 1-100,000 buffered w/ 8.4% NaHCO3 Instrument used: flexible razor blade   Hemostasis achieved with: pressure, aluminum chloride and electrodesiccation   Outcome: patient tolerated procedure well   Post-procedure details: sterile dressing applied and wound care instructions given   Dressing type: bandage and petrolatum     Specimen 1 - Surgical pathology Differential Diagnosis: CNCH vs AK vs CA Check Margins: No 0.8 cm scaly pink patch  AK (actinic keratosis) above the right earlobe adjacent to the sup anti tragus Bx proven - resolved and clear  Clear. Observe for recurrence. Call clinic for new or changing lesions.  Recommend regular skin exams, daily broad-spectrum spf 30+ sunscreen use, and photoprotection.    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  Observation.  Call clinic for new or changing moles.  Recommend daily use of broad spectrum spf 30+ sunscreen to sun-exposed areas.  May persist.  Consider treatment or biopsy if worsens.    Lentigines - Scattered tan macules - Discussed due to sun exposure - Benign, observe - Call for any changes  Seborrheic Keratoses - Stuck-on, waxy, tan-brown papules and plaques  - Discussed benign etiology and prognosis. - Observe - Call for any changes  Melanocytic Nevi - Tan-brown and/or pink-flesh-colored symmetric macules and papules - Benign appearing on exam today - Observation - Call clinic for new or changing moles - Recommend daily use of broad spectrum spf 30+ sunscreen to sun-exposed areas.   Hemangiomas - Red papules - Discussed benign nature - Observe - Call for any changes  Actinic Damage - diffuse scaly erythematous macules with underlying dyspigmentation - Recommend daily broad spectrum sunscreen SPF 30+ to sun-exposed areas, reapply every 2 hours as needed.  - Call for new or changing lesions.  Skin cancer screening performed today.  Return in about 1 year (  around 03/04/2021) for TBSE.  Luther Redo, CMA, am acting as scribe for Sarina Ser, MD .  Documentation: I have reviewed the above documentation for accuracy and completeness, and I agree with the above.  Sarina Ser, MD

## 2020-03-07 ENCOUNTER — Encounter: Payer: Self-pay | Admitting: Dermatology

## 2020-03-09 ENCOUNTER — Telehealth: Payer: Self-pay

## 2020-03-09 NOTE — Telephone Encounter (Signed)
-----   Message from Ralene Bathe, MD sent at 03/07/2020  3:42 PM EDT ----- Skin , right ear lobe RUPTURED FOLLICULITIS  Benign inflamed follicle Not further treatment at this time Recheck next visit

## 2020-03-09 NOTE — Telephone Encounter (Signed)
Patient informed of pathology results 

## 2020-03-19 ENCOUNTER — Encounter: Payer: PPO | Admitting: Dermatology

## 2020-05-29 DIAGNOSIS — H6063 Unspecified chronic otitis externa, bilateral: Secondary | ICD-10-CM | POA: Diagnosis not present

## 2020-05-29 DIAGNOSIS — H6123 Impacted cerumen, bilateral: Secondary | ICD-10-CM | POA: Diagnosis not present

## 2020-05-29 DIAGNOSIS — H61031 Chondritis of right external ear: Secondary | ICD-10-CM | POA: Diagnosis not present

## 2020-06-11 DIAGNOSIS — H43313 Vitreous membranes and strands, bilateral: Secondary | ICD-10-CM | POA: Diagnosis not present

## 2020-06-11 DIAGNOSIS — H2513 Age-related nuclear cataract, bilateral: Secondary | ICD-10-CM | POA: Diagnosis not present

## 2020-08-17 ENCOUNTER — Ambulatory Visit (INDEPENDENT_AMBULATORY_CARE_PROVIDER_SITE_OTHER): Payer: PPO

## 2020-08-17 ENCOUNTER — Other Ambulatory Visit: Payer: Self-pay

## 2020-08-17 ENCOUNTER — Encounter: Payer: Self-pay | Admitting: Podiatry

## 2020-08-17 ENCOUNTER — Ambulatory Visit: Payer: PPO | Admitting: Podiatry

## 2020-08-17 DIAGNOSIS — M7671 Peroneal tendinitis, right leg: Secondary | ICD-10-CM

## 2020-08-17 DIAGNOSIS — M7751 Other enthesopathy of right foot: Secondary | ICD-10-CM

## 2020-08-17 MED ORDER — METHYLPREDNISOLONE 4 MG PO TBPK: ORAL_TABLET | ORAL | 0 refills | Status: DC

## 2020-08-17 NOTE — Progress Notes (Signed)
He presents today chief concern of pain to the fifth metatarsal base of the right foot.  States that he is an avid exercise individual runs and walks a lot.  He says is been aching for the past 3 to 4 weeks says it comes and goes really no treatment.  States that he notices some difference with different shoes.  Denies any trauma.  Objective: Vital signs are stable alert oriented x3 pulses are palpable.  Neurologic sensorium is intact butyric flexors are intact muscle strength is normal and symmetrical.  Has tenderness on palpation of the base of the fifth metatarsal right foot at the insertion site of the peroneal brevis tendon.  There is an area of fluctuance within this tendon sheath.  Radiographs taken today demonstrate an osseously mature individual with what appears to be a very small stress avulsion of the most proximal portion of the fifth metatarsal base.  There is also some soft tissue swelling in the area consistent with bursitis or insertional tendinitis.  Assessment: Insertional peroneal tendinitis.  Plan: Discussed etiology pathology conservative surgical therapies.  At this point I injected 2 mg of dexamethasone to the point of maximal tenderness.  Tolerated procedure well without complications.  Also put him on methylprednisolone and recommended Voltaren gel.  I will follow-up with him in 1 month if necessary.

## 2020-08-24 ENCOUNTER — Other Ambulatory Visit: Payer: Self-pay | Admitting: Podiatry

## 2020-08-24 DIAGNOSIS — M7671 Peroneal tendinitis, right leg: Secondary | ICD-10-CM

## 2020-08-28 DIAGNOSIS — Z125 Encounter for screening for malignant neoplasm of prostate: Secondary | ICD-10-CM | POA: Diagnosis not present

## 2020-08-28 DIAGNOSIS — I10 Essential (primary) hypertension: Secondary | ICD-10-CM | POA: Diagnosis not present

## 2020-08-28 DIAGNOSIS — N1831 Chronic kidney disease, stage 3a: Secondary | ICD-10-CM | POA: Diagnosis not present

## 2020-08-28 DIAGNOSIS — E7800 Pure hypercholesterolemia, unspecified: Secondary | ICD-10-CM | POA: Diagnosis not present

## 2020-09-28 ENCOUNTER — Encounter: Payer: Self-pay | Admitting: Podiatry

## 2020-09-28 ENCOUNTER — Other Ambulatory Visit: Payer: Self-pay

## 2020-09-28 ENCOUNTER — Ambulatory Visit: Payer: PPO | Admitting: Podiatry

## 2020-09-28 DIAGNOSIS — M7671 Peroneal tendinitis, right leg: Secondary | ICD-10-CM | POA: Diagnosis not present

## 2020-09-28 NOTE — Progress Notes (Signed)
He presents today states that is been doing great as he refers to his peroneal tendinitis the flared up last Friday I did place Voltaren gel on it and it is completely well again.  He states his been doing fantastic with no pain.  Objective: Vital signs are stable he is alert oriented x3.  There is no erythema edema cellulitis drainage odor no pain on palpation.  Assessment: Well-healing peroneal tendinitis right.  Plan: Follow-up with me on an as-needed basis.

## 2020-10-16 DIAGNOSIS — E7800 Pure hypercholesterolemia, unspecified: Secondary | ICD-10-CM | POA: Diagnosis not present

## 2020-10-16 DIAGNOSIS — Z0000 Encounter for general adult medical examination without abnormal findings: Secondary | ICD-10-CM | POA: Diagnosis not present

## 2020-10-16 DIAGNOSIS — I739 Peripheral vascular disease, unspecified: Secondary | ICD-10-CM | POA: Diagnosis not present

## 2020-10-16 DIAGNOSIS — I10 Essential (primary) hypertension: Secondary | ICD-10-CM | POA: Diagnosis not present

## 2020-10-16 DIAGNOSIS — N1831 Chronic kidney disease, stage 3a: Secondary | ICD-10-CM | POA: Diagnosis not present

## 2020-10-16 DIAGNOSIS — H93A3 Pulsatile tinnitus, bilateral: Secondary | ICD-10-CM | POA: Diagnosis not present

## 2020-11-02 DIAGNOSIS — H10412 Chronic giant papillary conjunctivitis, left eye: Secondary | ICD-10-CM | POA: Diagnosis not present

## 2020-12-01 DIAGNOSIS — H93A3 Pulsatile tinnitus, bilateral: Secondary | ICD-10-CM | POA: Diagnosis not present

## 2020-12-01 DIAGNOSIS — I6523 Occlusion and stenosis of bilateral carotid arteries: Secondary | ICD-10-CM | POA: Diagnosis not present

## 2020-12-04 DIAGNOSIS — I6521 Occlusion and stenosis of right carotid artery: Secondary | ICD-10-CM | POA: Insufficient documentation

## 2020-12-04 DIAGNOSIS — I779 Disorder of arteries and arterioles, unspecified: Secondary | ICD-10-CM | POA: Insufficient documentation

## 2020-12-22 ENCOUNTER — Other Ambulatory Visit (INDEPENDENT_AMBULATORY_CARE_PROVIDER_SITE_OTHER): Payer: Self-pay | Admitting: Vascular Surgery

## 2020-12-22 DIAGNOSIS — I6521 Occlusion and stenosis of right carotid artery: Secondary | ICD-10-CM

## 2020-12-25 ENCOUNTER — Encounter (INDEPENDENT_AMBULATORY_CARE_PROVIDER_SITE_OTHER): Payer: PPO

## 2020-12-25 ENCOUNTER — Encounter (INDEPENDENT_AMBULATORY_CARE_PROVIDER_SITE_OTHER): Payer: PPO | Admitting: Vascular Surgery

## 2020-12-25 ENCOUNTER — Ambulatory Visit (INDEPENDENT_AMBULATORY_CARE_PROVIDER_SITE_OTHER): Payer: PPO

## 2020-12-25 ENCOUNTER — Encounter (INDEPENDENT_AMBULATORY_CARE_PROVIDER_SITE_OTHER): Payer: Self-pay | Admitting: Vascular Surgery

## 2020-12-25 ENCOUNTER — Other Ambulatory Visit: Payer: Self-pay

## 2020-12-25 ENCOUNTER — Encounter (INDEPENDENT_AMBULATORY_CARE_PROVIDER_SITE_OTHER): Payer: Self-pay

## 2020-12-25 ENCOUNTER — Ambulatory Visit (INDEPENDENT_AMBULATORY_CARE_PROVIDER_SITE_OTHER): Payer: PPO | Admitting: Vascular Surgery

## 2020-12-25 VITALS — BP 159/79 | HR 60 | Resp 16 | Ht 72.0 in | Wt 176.0 lb

## 2020-12-25 DIAGNOSIS — I10 Essential (primary) hypertension: Secondary | ICD-10-CM

## 2020-12-25 DIAGNOSIS — E785 Hyperlipidemia, unspecified: Secondary | ICD-10-CM | POA: Diagnosis not present

## 2020-12-25 DIAGNOSIS — I6521 Occlusion and stenosis of right carotid artery: Secondary | ICD-10-CM | POA: Diagnosis not present

## 2020-12-25 NOTE — Progress Notes (Signed)
MRN : [id number]  Jordan Leach. is a 70 y.o. (March 22, 1951) male who presents with chief complaint of  Chief Complaint  Patient presents with  . New Patient (Initial Visit)    Ref Anderson occlusion and stenosis of right carotid artery  .  History of Present Illness: Patient is referred from Dr. Ouida Sills for evaluation of carotid artery stenosis.  The patient has pulsatile tinnitus after having had tinnitus for many years.  This prompted an ultrasound which I have reviewed which was interpreted as 50 to 69% right ICA stenosis and 1 to 49% left ICA stenosis by duplex criteria. He denies focal neurologic deficits. Specifically, the patient denies amaurosis fugax, speech or swallowing difficulties, or arm or leg weakness or numbness   Current Outpatient Medications  Medication Sig Dispense Refill  . carvedilol (COREG) 25 MG tablet Take 25 mg by mouth 2 (two) times daily.    . clopidogrel (PLAVIX) 75 MG tablet TAKE ONE TABLET BY MOUTH ONCE A DAY    . diazepam (VALIUM) 2 MG tablet Take 2 mg by mouth daily.    Marland Kitchen EPINEPHrine (EPIPEN IJ) Inject as directed as needed.    . pantoprazole (PROTONIX) 40 MG tablet     . rosuvastatin (CRESTOR) 40 MG tablet Take 40 mg by mouth daily.    Marland Kitchen AFLURIA QUADRIVALENT 0.5 ML injection  (Patient not taking: Reported on 12/25/2020)    . methylPREDNISolone (MEDROL DOSEPAK) 4 MG TBPK tablet 6 day dose pack - take as directed (Patient not taking: Reported on 12/25/2020) 21 tablet 0  . pravastatin (PRAVACHOL) 40 MG tablet TAKE 1 TABLET BY MOUTH ONCE A DAY (Patient not taking: Reported on 12/25/2020)     No current facility-administered medications for this visit.    Past Medical History:  Diagnosis Date  . Actinic keratosis 10/21/2019   above the R earlobe adjacent to the sup anti tragus (bx proven)  . Dysplastic nevus 03/07/2018   L post shoulder    Past Surgical History:  Procedure Laterality Date  . WISDOM TOOTH EXTRACTION       Social History    Tobacco Use  . Smoking status: Never Smoker  . Smokeless tobacco: Never Used  Substance Use Topics  . Alcohol use: Yes  . Drug use: Never      Family History  Problem Relation Age of Onset  . Dementia Mother   . Stroke Father   no bleeding or clotting disorders  Allergies  Allergen Reactions  . Aspirin     Other reaction(s): Angioedema  . Sulfa Antibiotics Rash     REVIEW OF SYSTEMS (Negative unless checked)  Constitutional: [] Weight loss  [] Fever  [] Chills Cardiac: [] Chest pain   [] Chest pressure   [] Palpitations   [] Shortness of breath when laying flat   [] Shortness of breath at rest   [] Shortness of breath with exertion. Vascular:  [] Pain in legs with walking   [] Pain in legs at rest   [] Pain in legs when laying flat   [] Claudication   [] Pain in feet when walking  [] Pain in feet at rest  [] Pain in feet when laying flat   [] History of DVT   [] Phlebitis   [] Swelling in legs   [] Varicose veins   [] Non-healing ulcers Pulmonary:   [] Uses home oxygen   [] Productive cough   [] Hemoptysis   [] Wheeze  [] COPD   [] Asthma Neurologic:  [] Dizziness  [] Blackouts   [] Seizures   [] History of stroke   [] History of TIA  [] Aphasia   []   Temporary blindness   [] Dysphagia   [] Weakness or numbness in arms   [] Weakness or numbness in legs Musculoskeletal:  [] Arthritis   [] Joint swelling   [] Joint pain   [] Low back pain Hematologic:  [] Easy bruising  [] Easy bleeding   [] Hypercoagulable state   [] Anemic  [] Hepatitis Gastrointestinal:  [] Blood in stool   [] Vomiting blood  [] Gastroesophageal reflux/heartburn   [] Difficulty swallowing. Genitourinary:  [x] Chronic kidney disease   [] Difficult urination  [] Frequent urination  [] Burning with urination   [] Blood in urine Skin:  [] Rashes   [] Ulcers   [] Wounds Psychological:  [] History of anxiety   []  History of major depression.  Physical Examination  Vitals:   12/25/20 1112  BP: (!) 159/79  Pulse: 60  Resp: 16  Weight: 176 lb (79.8 kg)  Height: 6'  (1.829 m)   Body mass index is 23.87 kg/m. Gen:  WD/WN, NAD. Appears younger than stated age. Head: [place]/AT, No temporalis wasting. Ear/Nose/Throat: Hearing grossly intact, nares w/o erythema or drainage, trachea midline Eyes: Conjunctiva clear. Sclera non-icteric Neck: Supple.  Soft right carotid bruit  Pulmonary:  Good air movement, equal and clear to auscultation bilaterally.  Cardiac: RRR, No JVD Vascular:  Vessel Right Left  Radial Palpable Palpable       Musculoskeletal: M/S 5/5 throughout.  No deformity or atrophy. No edema. Neurologic: CN 2-12 intact. Sensation grossly intact in extremities.  Symmetrical.  Speech is fluent. Motor exam as listed above. Psychiatric: Judgment intact, Mood & affect appropriate for pt's clinical situation. Dermatologic: No rashes or ulcers noted.  No cellulitis or open wounds.      CBC No results found for: WBC, HGB, HCT, MCV, PLT  BMET No results found for: NA, K, CL, CO2, GLUCOSE, BUN, CREATININE, CALCIUM, GFRNONAA, GFRAA CrCl cannot be calculated (No successful lab value found.).  COAG No results found for: INR, PROTIME  Radiology No results found.   Assessment/Plan Hypertension, essential blood pressure control important in reducing the progression of atherosclerotic disease. On appropriate oral medications.   Hyperlipidemia lipid control important in reducing the progression of atherosclerotic disease. Continue statin therapy   Carotid stenosis, asymptomatic, right This prompted an ultrasound which I have reviewed which was interpreted as 50 to 69% right ICA stenosis and 1 to 49% left ICA stenosis by duplex criteria. Continue Plavix and Crestor. No role for intervention at this level. Recheck in six months.     Leotis Pain, MD  12/25/2020 12:45 PM    This note was created with Dragon medical transcription system.  Any errors from dictation are purely unintentional

## 2020-12-25 NOTE — Assessment & Plan Note (Signed)
blood pressure control important in reducing the progression of atherosclerotic disease. On appropriate oral medications.  

## 2020-12-25 NOTE — Assessment & Plan Note (Signed)
This prompted an ultrasound which I have reviewed which was interpreted as 50 to 69% right ICA stenosis and 1 to 49% left ICA stenosis by duplex criteria. Continue Plavix and Crestor. No role for intervention at this level. Recheck in six months.

## 2020-12-25 NOTE — Assessment & Plan Note (Signed)
lipid control important in reducing the progression of atherosclerotic disease. Continue statin therapy  

## 2021-02-26 DIAGNOSIS — H6123 Impacted cerumen, bilateral: Secondary | ICD-10-CM | POA: Diagnosis not present

## 2021-02-26 DIAGNOSIS — K219 Gastro-esophageal reflux disease without esophagitis: Secondary | ICD-10-CM | POA: Diagnosis not present

## 2021-02-26 DIAGNOSIS — H6063 Unspecified chronic otitis externa, bilateral: Secondary | ICD-10-CM | POA: Diagnosis not present

## 2021-02-26 DIAGNOSIS — H61031 Chondritis of right external ear: Secondary | ICD-10-CM | POA: Diagnosis not present

## 2021-03-08 DIAGNOSIS — E7800 Pure hypercholesterolemia, unspecified: Secondary | ICD-10-CM | POA: Diagnosis not present

## 2021-03-11 ENCOUNTER — Other Ambulatory Visit: Payer: Self-pay

## 2021-03-11 ENCOUNTER — Ambulatory Visit: Payer: PPO | Admitting: Dermatology

## 2021-03-11 DIAGNOSIS — D229 Melanocytic nevi, unspecified: Secondary | ICD-10-CM

## 2021-03-11 DIAGNOSIS — L603 Nail dystrophy: Secondary | ICD-10-CM | POA: Diagnosis not present

## 2021-03-11 DIAGNOSIS — L814 Other melanin hyperpigmentation: Secondary | ICD-10-CM | POA: Diagnosis not present

## 2021-03-11 DIAGNOSIS — L821 Other seborrheic keratosis: Secondary | ICD-10-CM | POA: Diagnosis not present

## 2021-03-11 DIAGNOSIS — Z86018 Personal history of other benign neoplasm: Secondary | ICD-10-CM

## 2021-03-11 DIAGNOSIS — Z1283 Encounter for screening for malignant neoplasm of skin: Secondary | ICD-10-CM | POA: Diagnosis not present

## 2021-03-11 DIAGNOSIS — L578 Other skin changes due to chronic exposure to nonionizing radiation: Secondary | ICD-10-CM | POA: Diagnosis not present

## 2021-03-11 DIAGNOSIS — D1800 Hemangioma unspecified site: Secondary | ICD-10-CM | POA: Diagnosis not present

## 2021-03-11 NOTE — Patient Instructions (Signed)

## 2021-03-11 NOTE — Progress Notes (Signed)
   Follow-Up Visit   Subjective  Jordan Leach. is a 70 y.o. male who presents for the following: Annual Exam (Hx dysplastic nevus and AK's ). The patient presents for Total-Body Skin Exam (TBSE) for skin cancer screening and mole check.  The following portions of the chart were reviewed this encounter and updated as appropriate:   Tobacco  Allergies  Meds  Problems  Med Hx  Surg Hx  Fam Hx     Review of Systems:  No other skin or systemic complaints except as noted in HPI or Assessment and Plan.  Objective  Well appearing patient in no apparent distress; mood and affect are within normal limits.  A full examination was performed including scalp, head, eyes, ears, nose, lips, neck, chest, axillae, abdomen, back, buttocks, bilateral upper extremities, bilateral lower extremities, hands, feet, fingers, toes, fingernails, and toenails. All findings within normal limits unless otherwise noted below.   Assessment & Plan  Nail dystrophy B/L toenails Likely due to trauma and pressure from frequent walking. Patient states that Dr. Milinda Pointer did a culture which was negative for tinea.  No treatment at this time.  Will observe.  Watch for change.  Lentigines - Scattered tan macules - Due to sun exposure - Benign-appering, observe - Recommend daily broad spectrum sunscreen SPF 30+ to sun-exposed areas, reapply every 2 hours as needed. - Call for any changes  Seborrheic Keratoses - Stuck-on, waxy, tan-brown papules and/or plaques  - Benign-appearing - Discussed benign etiology and prognosis. - Observe - Call for any changes  Melanocytic Nevi - Tan-brown and/or pink-flesh-colored symmetric macules and papules - Benign appearing on exam today - Observation - Call clinic for new or changing moles - Recommend daily use of broad spectrum spf 30+ sunscreen to sun-exposed areas.   Hemangiomas - Red papules - Discussed benign nature - Observe - Call for any changes  Actinic  Damage - Chronic condition, secondary to cumulative UV/sun exposure - diffuse scaly erythematous macules with underlying dyspigmentation - Recommend daily broad spectrum sunscreen SPF 30+ to sun-exposed areas, reapply every 2 hours as needed.  - Staying in the shade or wearing long sleeves, sun glasses (UVA+UVB protection) and wide brim hats (4-inch brim around the entire circumference of the hat) are also recommended for sun protection.  - Call for new or changing lesions.  History of Dysplastic Nevi - No evidence of recurrence today - Recommend regular full body skin exams - Recommend daily broad spectrum sunscreen SPF 30+ to sun-exposed areas, reapply every 2 hours as needed.  - Call if any new or changing lesions are noted between office visits  Skin cancer screening performed today.  Return in about 1 year (around 03/11/2022) for TBSE.  Luther Redo, CMA, am acting as scribe for Sarina Ser, MD . Documentation: I have reviewed the above documentation for accuracy and completeness, and I agree with the above.  Sarina Ser, MD

## 2021-03-16 ENCOUNTER — Encounter: Payer: Self-pay | Admitting: Dermatology

## 2021-04-15 DIAGNOSIS — I10 Essential (primary) hypertension: Secondary | ICD-10-CM | POA: Diagnosis not present

## 2021-04-15 DIAGNOSIS — E7800 Pure hypercholesterolemia, unspecified: Secondary | ICD-10-CM | POA: Diagnosis not present

## 2021-04-15 DIAGNOSIS — N1831 Chronic kidney disease, stage 3a: Secondary | ICD-10-CM | POA: Diagnosis not present

## 2021-04-22 DIAGNOSIS — N1831 Chronic kidney disease, stage 3a: Secondary | ICD-10-CM | POA: Diagnosis not present

## 2021-04-22 DIAGNOSIS — E7800 Pure hypercholesterolemia, unspecified: Secondary | ICD-10-CM | POA: Diagnosis not present

## 2021-04-22 DIAGNOSIS — I779 Disorder of arteries and arterioles, unspecified: Secondary | ICD-10-CM | POA: Diagnosis not present

## 2021-04-22 DIAGNOSIS — I739 Peripheral vascular disease, unspecified: Secondary | ICD-10-CM | POA: Diagnosis not present

## 2021-04-22 DIAGNOSIS — I10 Essential (primary) hypertension: Secondary | ICD-10-CM | POA: Diagnosis not present

## 2021-04-22 DIAGNOSIS — Z23 Encounter for immunization: Secondary | ICD-10-CM | POA: Diagnosis not present

## 2021-06-16 ENCOUNTER — Ambulatory Visit: Payer: PPO | Admitting: Podiatry

## 2021-07-02 ENCOUNTER — Other Ambulatory Visit: Payer: Self-pay

## 2021-07-02 ENCOUNTER — Ambulatory Visit (INDEPENDENT_AMBULATORY_CARE_PROVIDER_SITE_OTHER): Payer: PPO

## 2021-07-02 ENCOUNTER — Ambulatory Visit (INDEPENDENT_AMBULATORY_CARE_PROVIDER_SITE_OTHER): Payer: PPO | Admitting: Vascular Surgery

## 2021-07-02 VITALS — BP 151/88 | HR 56 | Ht 72.0 in | Wt 178.0 lb

## 2021-07-02 DIAGNOSIS — N183 Chronic kidney disease, stage 3 unspecified: Secondary | ICD-10-CM | POA: Diagnosis not present

## 2021-07-02 DIAGNOSIS — E785 Hyperlipidemia, unspecified: Secondary | ICD-10-CM | POA: Diagnosis not present

## 2021-07-02 DIAGNOSIS — I6521 Occlusion and stenosis of right carotid artery: Secondary | ICD-10-CM

## 2021-07-02 DIAGNOSIS — I10 Essential (primary) hypertension: Secondary | ICD-10-CM | POA: Diagnosis not present

## 2021-07-02 DIAGNOSIS — I6523 Occlusion and stenosis of bilateral carotid arteries: Secondary | ICD-10-CM

## 2021-07-02 NOTE — Progress Notes (Signed)
MRN : [id number]  Jordan Colt. is a 70 y.o. ([birth year]) male who presents with chief complaint of  Chief Complaint  Patient presents with   Follow-up    6 mo Carotid  .  History of Present Illness: Patient returns in follow-up of his carotid disease.  He is doing well.  He had no complaints or problems since last visit 6 months ago.  He denies focal neurologic symptoms. Specifically, the patient denies amaurosis fugax, speech or swallowing difficulties, or arm or leg weakness or numbness. Duplex today shows 1 to 39% ICA stenosis bilaterally.  The velocities do not appear as high as the previous outside study.  Current Outpatient Medications  Medication Sig Dispense Refill   AFLURIA QUADRIVALENT 0.5 ML injection      carvedilol (COREG) 25 MG tablet Take 25 mg by mouth 2 (two) times daily.     clopidogrel (PLAVIX) 75 MG tablet TAKE ONE TABLET BY MOUTH ONCE A DAY     diazepam (VALIUM) 2 MG tablet Take 2 mg by mouth daily.     EPINEPHrine (EPIPEN IJ) Inject as directed as needed.     methylPREDNISolone (MEDROL DOSEPAK) 4 MG TBPK tablet 6 day dose pack - take as directed 21 tablet 0   pantoprazole (PROTONIX) 40 MG tablet      pravastatin (PRAVACHOL) 40 MG tablet TAKE 1 TABLET BY MOUTH ONCE A DAY     rosuvastatin (CRESTOR) 40 MG tablet Take 40 mg by mouth daily.     No current facility-administered medications for this visit.    Past Medical History:  Diagnosis Date   Actinic keratosis 10/21/2019   above the R earlobe adjacent to the sup anti tragus (bx proven)   Dysplastic nevus 03/07/2018   L post shoulder    Past Surgical History:  Procedure Laterality Date   WISDOM TOOTH EXTRACTION       Social History   Tobacco Use   Smoking status: Never   Smokeless tobacco: Never  Substance Use Topics   Alcohol use: Yes   Drug use: Never       Family History  Problem Relation Age of Onset   Dementia Mother    Stroke Father      Allergies  Allergen Reactions    Aspirin     Other reaction(s): Angioedema   Sulfa Antibiotics Rash     REVIEW OF SYSTEMS (Negative unless checked)  Constitutional: [] Weight loss  [] Fever  [] Chills Cardiac: [] Chest pain   [] Chest pressure   [] Palpitations   [] Shortness of breath when laying flat   [] Shortness of breath at rest   [] Shortness of breath with exertion. Vascular:  [] Pain in legs with walking   [] Pain in legs at rest   [] Pain in legs when laying flat   [] Claudication   [] Pain in feet when walking  [] Pain in feet at rest  [] Pain in feet when laying flat   [] History of DVT   [] Phlebitis   [] Swelling in legs   [] Varicose veins   [] Non-healing ulcers Pulmonary:   [] Uses home oxygen   [] Productive cough   [] Hemoptysis   [] Wheeze  [] COPD   [] Asthma Neurologic:  [] Dizziness  [] Blackouts   [] Seizures   [] History of stroke   [] History of TIA  [] Aphasia   [] Temporary blindness   [] Dysphagia   [] Weakness or numbness in arms   [] Weakness or numbness in legs Musculoskeletal:  [] Arthritis   [] Joint swelling   [] Joint pain   [] Low back pain Hematologic:  []   Easy bruising  [] Easy bleeding   [] Hypercoagulable state   [] Anemic  [] Hepatitis Gastrointestinal:  [] Blood in stool   [] Vomiting blood  [] Gastroesophageal reflux/heartburn   [] Difficulty swallowing. Genitourinary:  [x] Chronic kidney disease   [] Difficult urination  [] Frequent urination  [] Burning with urination   [] Blood in urine Skin:  [] Rashes   [] Ulcers   [] Wounds Psychological:  [] History of anxiety   []  History of major depression.  Physical Examination  Vitals:   07/02/21 1025  BP: (!) 151/88  Pulse: (!) 56  Weight: 178 lb (80.7 kg)  Height: 6' (1.829 m)   Body mass index is 24.14 kg/m. Gen:  WD/WN, NAD.  Appears younger than stated age Head: [place]/AT, No temporalis wasting. Ear/Nose/Throat: Hearing grossly intact, nares w/o erythema or drainage, trachea midline Eyes: Conjunctiva clear. Sclera non-icteric Neck: Supple.  No bruit  Pulmonary:  Good air movement,  equal and clear to auscultation bilaterally.  Cardiac: RRR, No JVD Vascular:  Vessel Right Left  Radial Palpable Palpable       Musculoskeletal: M/S 5/5 throughout.  No deformity or atrophy.  No edema. Neurologic: CN 2-12 intact. Sensation grossly intact in extremities.  Symmetrical.  Speech is fluent. Motor exam as listed above. Psychiatric: Judgment intact, Mood & affect appropriate for pt's clinical situation. Dermatologic: No rashes or ulcers noted.  No cellulitis or open wounds.     CBC No results found for: WBC, HGB, HCT, MCV, PLT  BMET No results found for: NA, K, CL, CO2, GLUCOSE, BUN, CREATININE, CALCIUM, GFRNONAA, GFRAA CrCl cannot be calculated (No successful lab value found.).  COAG No results found for: INR, PROTIME  Radiology No results found.   Assessment/Plan Hypertension, essential blood pressure control important in reducing the progression of atherosclerotic disease. On appropriate oral medications.     Hyperlipidemia lipid control important in reducing the progression of atherosclerotic disease. Continue statin therapy  Chronic kidney disease (CKD), stage III (moderate) (HCC) Avoid contrast unless necessary for higher degree of stenosis.  Carotid artery disease (HCC) Duplex today shows 1 to 39% ICA stenosis bilaterally.  The velocities do not appear as high as the previous outside study.  At this point, we will follow this on an annual basis with duplex.  He will continue Plavix and his statin agent.  He will contact our office with problems in the interim.    Leotis Pain, MD  07/02/2021 11:05 AM    This note was created with Dragon medical transcription system.  Any errors from dictation are purely unintentional

## 2021-07-02 NOTE — Assessment & Plan Note (Signed)
Duplex today shows 1 to 39% ICA stenosis bilaterally.  The velocities do not appear as high as the previous outside study.  At this point, we will follow this on an annual basis with duplex.  He will continue Plavix and his statin agent.  He will contact our office with problems in the interim.

## 2021-07-02 NOTE — Assessment & Plan Note (Signed)
Avoid contrast unless necessary for higher degree of stenosis.

## 2021-07-05 ENCOUNTER — Other Ambulatory Visit: Payer: Self-pay

## 2021-07-05 ENCOUNTER — Ambulatory Visit: Payer: PPO | Admitting: Podiatry

## 2021-07-05 DIAGNOSIS — M778 Other enthesopathies, not elsewhere classified: Secondary | ICD-10-CM

## 2021-07-05 DIAGNOSIS — M7751 Other enthesopathy of right foot: Secondary | ICD-10-CM

## 2021-07-05 DIAGNOSIS — B351 Tinea unguium: Secondary | ICD-10-CM | POA: Diagnosis not present

## 2021-07-05 DIAGNOSIS — M79676 Pain in unspecified toe(s): Secondary | ICD-10-CM

## 2021-07-05 NOTE — Progress Notes (Signed)
He presents today subfifth met head pain right foot.  He also has a painful left great toe.  He is also concerned about the thickness of his hallux nail.  Objective: Vital signs are stable he is alert and oriented x3 pulses are palpable bilateral.  He has fluctuance beneath the head of the fifth metatarsal of the right foot with an overlying benign skin lesion.  He also has a thick dystrophic hallux nail right foot.  Left foot demonstrates ingrown nail fibular border of the hallux left.  No purulence no malodor.  Assessment: Pain in limb secondary to ingrown nail fibular border hallux left.  Also nail dystrophy hallux right.  Bursitis fifth metatarsal plantarly and a benign hyperkeratotic lesion.  Plan: Discussed etiology pathology and surgical therapies at this point I injected the the fifth of bursitis with 2 mg of dexamethasone.  I also debrided the benign skin lesion.  I placed a Band-Aid.  I also debrided the hallux nail right in thickness and length and hope to make this feel better to him.  I also performed a chemical matricectomy to the fibular border of the hallux left with local anesthetic.  He received both oral and written home-going instructions for the care and soaking of the toe as well as a prescription for Corticosporin otic I will follow-up with him in 2 weeks.  May need to consider total nail avulsion hallux right

## 2021-07-05 NOTE — Patient Instructions (Signed)

## 2021-07-07 DIAGNOSIS — J069 Acute upper respiratory infection, unspecified: Secondary | ICD-10-CM | POA: Diagnosis not present

## 2021-07-28 ENCOUNTER — Other Ambulatory Visit: Payer: Self-pay

## 2021-07-28 ENCOUNTER — Encounter: Payer: Self-pay | Admitting: Podiatry

## 2021-07-28 ENCOUNTER — Ambulatory Visit: Payer: PPO | Admitting: Podiatry

## 2021-07-28 DIAGNOSIS — L600 Ingrowing nail: Secondary | ICD-10-CM

## 2021-07-28 DIAGNOSIS — Z9889 Other specified postprocedural states: Secondary | ICD-10-CM

## 2021-07-28 NOTE — Progress Notes (Signed)
He presents today for follow-up for matricectomy fibular border hallux left states that his toenail that I trimmed on the right foot is doing just great and he thinks that the left one is healing healing well both healing a little slower than he would think.  Objective: Vital signs are stable he is alert and oriented x3 fibular border of the hallux left does demonstrate some mild erythema and a small eschar present proximal nail fold however it does appear to be healing very nicely.  There is no purulence no malodor.  Assessment: Well-healing surgical foot hallux left.  Plan: Continue to soak every other day Epson salts and warm water until the erythema has Completely Subsided and the Eschar Has Resolved.  Should This Become Painful Red Swollen or Draining He Is to Notify us Immediately.  Otherwise I Will Follow-Up with Him As Needed.

## 2021-08-05 DIAGNOSIS — R6889 Other general symptoms and signs: Secondary | ICD-10-CM | POA: Diagnosis not present

## 2021-10-27 DIAGNOSIS — N1831 Chronic kidney disease, stage 3a: Secondary | ICD-10-CM | POA: Diagnosis not present

## 2021-10-27 DIAGNOSIS — I10 Essential (primary) hypertension: Secondary | ICD-10-CM | POA: Diagnosis not present

## 2021-10-27 DIAGNOSIS — E7800 Pure hypercholesterolemia, unspecified: Secondary | ICD-10-CM | POA: Diagnosis not present

## 2021-11-03 DIAGNOSIS — N1831 Chronic kidney disease, stage 3a: Secondary | ICD-10-CM | POA: Diagnosis not present

## 2021-11-03 DIAGNOSIS — I779 Disorder of arteries and arterioles, unspecified: Secondary | ICD-10-CM | POA: Diagnosis not present

## 2021-11-03 DIAGNOSIS — I739 Peripheral vascular disease, unspecified: Secondary | ICD-10-CM | POA: Diagnosis not present

## 2021-11-03 DIAGNOSIS — Z0000 Encounter for general adult medical examination without abnormal findings: Secondary | ICD-10-CM | POA: Diagnosis not present

## 2021-11-03 DIAGNOSIS — I10 Essential (primary) hypertension: Secondary | ICD-10-CM | POA: Diagnosis not present

## 2021-11-03 DIAGNOSIS — E7800 Pure hypercholesterolemia, unspecified: Secondary | ICD-10-CM | POA: Diagnosis not present

## 2021-11-26 DIAGNOSIS — J301 Allergic rhinitis due to pollen: Secondary | ICD-10-CM | POA: Diagnosis not present

## 2021-11-26 DIAGNOSIS — H6063 Unspecified chronic otitis externa, bilateral: Secondary | ICD-10-CM | POA: Diagnosis not present

## 2021-11-26 DIAGNOSIS — H6123 Impacted cerumen, bilateral: Secondary | ICD-10-CM | POA: Diagnosis not present

## 2022-03-14 ENCOUNTER — Encounter: Payer: Self-pay | Admitting: Dermatology

## 2022-03-14 ENCOUNTER — Ambulatory Visit: Payer: PPO | Admitting: Dermatology

## 2022-03-14 DIAGNOSIS — L821 Other seborrheic keratosis: Secondary | ICD-10-CM

## 2022-03-14 DIAGNOSIS — H61001 Unspecified perichondritis of right external ear: Secondary | ICD-10-CM | POA: Diagnosis not present

## 2022-03-14 DIAGNOSIS — D485 Neoplasm of uncertain behavior of skin: Secondary | ICD-10-CM

## 2022-03-14 DIAGNOSIS — D229 Melanocytic nevi, unspecified: Secondary | ICD-10-CM

## 2022-03-14 DIAGNOSIS — D1800 Hemangioma unspecified site: Secondary | ICD-10-CM

## 2022-03-14 DIAGNOSIS — L814 Other melanin hyperpigmentation: Secondary | ICD-10-CM

## 2022-03-14 DIAGNOSIS — L578 Other skin changes due to chronic exposure to nonionizing radiation: Secondary | ICD-10-CM

## 2022-03-14 DIAGNOSIS — Z86018 Personal history of other benign neoplasm: Secondary | ICD-10-CM

## 2022-03-14 DIAGNOSIS — Z1283 Encounter for screening for malignant neoplasm of skin: Secondary | ICD-10-CM

## 2022-03-14 DIAGNOSIS — D239 Other benign neoplasm of skin, unspecified: Secondary | ICD-10-CM

## 2022-03-14 NOTE — Progress Notes (Signed)
Follow-Up Visit   Subjective  Jordan Leach. is a 71 y.o. male who presents for the following: Annual Exam (History of Dysplastic nevi - The patient presents for Total-Body Skin Exam (TBSE) for skin cancer screening and mole check.  The patient has spots, moles and lesions to be evaluated, some may be new or changing and the patient has concerns that these could be cancer./).  The following portions of the chart were reviewed this encounter and updated as appropriate:   Tobacco  Allergies  Meds  Problems  Med Hx  Surg Hx  Fam Hx     Review of Systems:  No other skin or systemic complaints except as noted in HPI or Assessment and Plan.  Objective  Well appearing patient in no apparent distress; mood and affect are within normal limits.  A full examination was performed including scalp, head, eyes, ears, nose, lips, neck, chest, axillae, abdomen, back, buttocks, bilateral upper extremities, bilateral lower extremities, hands, feet, fingers, toes, fingernails, and toenails. All findings within normal limits unless otherwise noted below.  Right Ear Flesh colored papule 0.7 cm      Assessment & Plan   History of Dysplastic Nevi - No evidence of recurrence today - Recommend regular full body skin exams - Recommend daily broad spectrum sunscreen SPF 30+ to sun-exposed areas, reapply every 2 hours as needed.  - Call if any new or changing lesions are noted between office visits  Lentigines - Scattered tan macules - Due to sun exposure - Benign-appearing, observe - Recommend daily broad spectrum sunscreen SPF 30+ to sun-exposed areas, reapply every 2 hours as needed. - Call for any changes  Seborrheic Keratoses - Stuck-on, waxy, tan-brown papules and/or plaques  - Benign-appearing - Discussed benign etiology and prognosis. - Observe - Call for any changes  Melanocytic Nevi - Tan-brown and/or pink-flesh-colored symmetric macules and papules - Benign appearing on  exam today - Observation - Call clinic for new or changing moles - Recommend daily use of broad spectrum spf 30+ sunscreen to sun-exposed areas.   Hemangiomas - Red papules - Discussed benign nature - Observe - Call for any changes  Actinic Damage - Chronic condition, secondary to cumulative UV/sun exposure - diffuse scaly erythematous macules with underlying dyspigmentation - Recommend daily broad spectrum sunscreen SPF 30+ to sun-exposed areas, reapply every 2 hours as needed.  - Staying in the shade or wearing long sleeves, sun glasses (UVA+UVB protection) and wide brim hats (4-inch brim around the entire circumference of the hat) are also recommended for sun protection.  - Call for new or changing lesions.  Skin cancer screening performed today.  Neoplasm of uncertain behavior of skin Right Ear  Epidermal / dermal shaving  Lesion diameter (cm):  0.7 Informed consent: discussed and consent obtained   Timeout: patient name, date of birth, surgical site, and procedure verified   Procedure prep:  Patient was prepped and draped in usual sterile fashion Prep type:  Isopropyl alcohol Anesthesia: the lesion was anesthetized in a standard fashion   Anesthetic:  1% lidocaine w/ epinephrine 1-100,000 buffered w/ 8.4% NaHCO3 Instrument used: flexible razor blade   Hemostasis achieved with: pressure, aluminum chloride and electrodesiccation   Outcome: patient tolerated procedure well   Post-procedure details: sterile dressing applied and wound care instructions given   Dressing type: bandage and petrolatum    Specimen 1 - Surgical pathology Differential Diagnosis: CNCH vs other   Check Margins: No  May consider IL injectionin the future if +  CNCH and not improving.   Return in about 1 year (around 03/15/2023) for TBSE.  I, Ashok Cordia, CMA, am acting as scribe for Sarina Ser, MD . Documentation: I have reviewed the above documentation for accuracy and completeness, and I  agree with the above.  Sarina Ser, MD

## 2022-03-14 NOTE — Patient Instructions (Signed)
Wound Care Instructions  Cleanse wound gently with soap and water once a day then pat dry with clean gauze. Apply a thing coat of Petrolatum (petroleum jelly, "Vaseline") over the wound (unless you have an allergy to this). We recommend that you use a new, sterile tube of Vaseline. Do not pick or remove scabs. Do not remove the yellow or white "healing tissue" from the base of the wound.  Cover the wound with fresh, clean, nonstick gauze and secure with paper tape. You may use Band-Aids in place of gauze and tape if the would is small enough, but would recommend trimming much of the tape off as there is often too much. Sometimes Band-Aids can irritate the skin.  You should call the office for your biopsy report after 1 week if you have not already been contacted.  If you experience any problems, such as abnormal amounts of bleeding, swelling, significant bruising, significant pain, or evidence of infection, please call the office immediately.  FOR ADULT SURGERY PATIENTS: If you need something for pain relief you may take 1 extra strength Tylenol (acetaminophen) AND 2 Ibuprofen (200mg each) together every 4 hours as needed for pain. (do not take these if you are allergic to them or if you have a reason you should not take them.) Typically, you may only need pain medication for 1 to 3 days.    Due to recent changes in healthcare laws, you may see results of your pathology and/or laboratory studies on MyChart before the doctors have had a chance to review them. We understand that in some cases there may be results that are confusing or concerning to you. Please understand that not all results are received at the same time and often the doctors may need to interpret multiple results in order to provide you with the best plan of care or course of treatment. Therefore, we ask that you please give us 2 business days to thoroughly review all your results before contacting the office for clarification. Should we  see a critical lab result, you will be contacted sooner.   If You Need Anything After Your Visit  If you have any questions or concerns for your doctor, please call our main line at [phone] and press option 4 to reach your doctor's medical assistant. If no one answers, please leave a voicemail as directed and we will return your call as soon as possible. Messages left after 4 pm will be answered the following business day.   You may also send us a message via MyChart. We typically respond to MyChart messages within 1-2 business days.  For prescription refills, please ask your pharmacy to contact our office. Our fax number is [phone].  If you have an urgent issue when the clinic is closed that cannot wait until the next business day, you can page your doctor at the number below.    Please note that while we do our best to be available for urgent issues outside of office hours, we are not available 24/7.   If you have an urgent issue and are unable to reach us, you may choose to seek medical care at your doctor's office, retail clinic, urgent care center, or emergency room.  If you have a medical emergency, please immediately call 911 or go to the emergency department.  Pager Numbers  - Dr. Kowalski: [phone]  - Dr. Moye: [phone]  - Dr. Stewart: [phone]  In the event of inclement weather, please call our main line at [phone]   for an update on the status of any delays or closures.  Dermatology Medication Tips: Please keep the boxes that topical medications come in in order to help keep track of the instructions about where and how to use these. Pharmacies typically print the medication instructions only on the boxes and not directly on the medication tubes.   If your medication is too expensive, please contact our office at [phone] option 4 or send us a message through MyChart.   We are unable to tell what your co-pay for medications will be in advance  as this is different depending on your insurance coverage. However, we may be able to find a substitute medication at lower cost or fill out paperwork to get insurance to cover a needed medication.   If a prior authorization is required to get your medication covered by your insurance company, please allow us 1-2 business days to complete this process.  Drug prices often vary depending on where the prescription is filled and some pharmacies may offer cheaper prices.  The website www.goodrx.com contains coupons for medications through different pharmacies. The prices here do not account for what the cost may be with help from insurance (it may be cheaper with your insurance), but the website can give you the price if you did not use any insurance.  - You can print the associated coupon and take it with your prescription to the pharmacy.  - You may also stop by our office during regular business hours and pick up a GoodRx coupon card.  - If you need your prescription sent electronically to a different pharmacy, notify our office through [facility] MyChart or by phone at [phone] option 4.     Si Usted Necesita Algo Despus de Su Visita  Tambin puede enviarnos un mensaje a travs de MyChart. Por lo general respondemos a los mensajes de MyChart en el transcurso de 1 a 2 das hbiles.  Para renovar recetas, por favor pida a su farmacia que se ponga en contacto con nuestra oficina. Nuestro nmero de fax es el [phone].  Si tiene un asunto urgente cuando la clnica est cerrada y que no puede esperar hasta el siguiente da hbil, puede llamar/localizar a su doctor(a) al nmero que aparece a continuacin.   Por favor, tenga en cuenta que aunque hacemos todo lo posible para estar disponibles para asuntos urgentes fuera del horario de oficina, no estamos disponibles las 24 horas del da, los 7 das de la semana.   Si tiene un problema urgente y no puede comunicarse con nosotros, puede optar  por buscar atencin mdica  en el consultorio de su doctor(a), en una clnica privada, en un centro de atencin urgente o en una sala de emergencias.  Si tiene una emergencia mdica, por favor llame inmediatamente al 911 o vaya a la sala de emergencias.  Nmeros de bper  - Dr. Kowalski: [phone]  - Dra. Moye: [phone]  - Dra. Stewart: [phone]  En caso de inclemencias del tiempo, por favor llame a nuestra lnea principal al [phone] para una actualizacin sobre el estado de cualquier retraso o cierre.  Consejos para la medicacin en dermatologa: Por favor, guarde las cajas en las que vienen los medicamentos de uso tpico para ayudarle a seguir las instrucciones sobre dnde y cmo usarlos. Las farmacias generalmente imprimen las instrucciones del medicamento slo en las cajas y no directamente en los tubos del medicamento.   Si su medicamento es muy caro, por favor, pngase en contacto con nuestra   oficina llamando al [phone] y presione la opcin 4 o envenos un mensaje a travs de MyChart.   No podemos decirle cul ser su copago por los medicamentos por adelantado ya que esto es diferente dependiendo de la cobertura de su seguro. Sin embargo, es posible que podamos encontrar un medicamento sustituto a menor costo o llenar un formulario para que el seguro cubra el medicamento que se considera necesario.   Si se requiere una autorizacin previa para que su compaa de seguros cubra su medicamento, por favor permtanos de 1 a 2 das hbiles para completar este proceso.  Los precios de los medicamentos varan con frecuencia dependiendo del lugar de dnde se surte la receta y alguna farmacias pueden ofrecer precios ms baratos.  El sitio web www.goodrx.com tiene cupones para medicamentos de diferentes farmacias. Los precios aqu no tienen en cuenta lo que podra costar con la ayuda del seguro (puede ser ms barato con su seguro), pero el sitio web puede darle el precio si  no utiliz ningn seguro.  - Puede imprimir el cupn correspondiente y llevarlo con su receta a la farmacia.  - Tambin puede pasar por nuestra oficina durante el horario de atencin regular y recoger una tarjeta de cupones de GoodRx.  - Si necesita que su receta se enve electrnicamente a una farmacia diferente, informe a nuestra oficina a travs de MyChart de [facility] o por telfono llamando al [phone] y presione la opcin 4.  

## 2022-03-16 ENCOUNTER — Telehealth: Payer: Self-pay

## 2022-03-16 NOTE — Telephone Encounter (Signed)
-----   Message from Ralene Bathe, MD sent at 03/15/2022  6:13 PM EDT ----- Diagnosis Skin , right ear CHONDRODERMATITIS NODULARIS HELICIS  Benign chondrodermatitis = inflammation of ear cartilage As suspected May persist or recur and be painful on and off No further treatment at this time.

## 2022-03-16 NOTE — Telephone Encounter (Signed)
Advised patient of results/hd  

## 2022-07-04 ENCOUNTER — Other Ambulatory Visit (INDEPENDENT_AMBULATORY_CARE_PROVIDER_SITE_OTHER): Payer: Self-pay | Admitting: Vascular Surgery

## 2022-07-04 DIAGNOSIS — I6523 Occlusion and stenosis of bilateral carotid arteries: Secondary | ICD-10-CM

## 2022-07-05 ENCOUNTER — Encounter (INDEPENDENT_AMBULATORY_CARE_PROVIDER_SITE_OTHER): Payer: Self-pay | Admitting: Vascular Surgery

## 2022-07-05 ENCOUNTER — Ambulatory Visit (INDEPENDENT_AMBULATORY_CARE_PROVIDER_SITE_OTHER): Payer: PPO | Admitting: Vascular Surgery

## 2022-07-05 ENCOUNTER — Ambulatory Visit (INDEPENDENT_AMBULATORY_CARE_PROVIDER_SITE_OTHER): Payer: PPO

## 2022-07-05 VITALS — BP 150/85 | HR 57 | Resp 18 | Ht 72.0 in | Wt 177.2 lb

## 2022-07-05 DIAGNOSIS — E785 Hyperlipidemia, unspecified: Secondary | ICD-10-CM | POA: Diagnosis not present

## 2022-07-05 DIAGNOSIS — I6523 Occlusion and stenosis of bilateral carotid arteries: Secondary | ICD-10-CM

## 2022-07-05 DIAGNOSIS — I10 Essential (primary) hypertension: Secondary | ICD-10-CM

## 2022-07-05 NOTE — Assessment & Plan Note (Signed)
Carotid duplex today demonstrates 1 to 39% right ICA stenosis with common carotid disease of less than 50% and no significant left carotid stenosis.  On Plavix and a statin agent.  No role for intervention at this level.  Recheck in 1 year.

## 2022-07-05 NOTE — Assessment & Plan Note (Signed)
lipid control important in reducing the progression of atherosclerotic disease. Continue statin therapy  

## 2022-07-05 NOTE — Progress Notes (Signed)
MRN : [id number]  Jordan Leach. is a 71 y.o. (1951/03/09) male who presents with chief complaint of No chief complaint on file. Marland Kitchen  History of Present Illness: Patient returns today in follow up of his carotid disease. He is doing well. Today.  No new complaints. No focal neurologic symptoms. Specifically, the patient denies amaurosis fugax, speech or swallowing difficulties, or arm or leg weakness or numbness.  Carotid duplex today demonstrates 1 to 39% right ICA stenosis with common carotid disease of less than 50% and no significant left carotid stenosis.  Current Outpatient Medications  Medication Sig Dispense Refill   carvedilol (COREG) 25 MG tablet Take 25 mg by mouth 2 (two) times daily.     clopidogrel (PLAVIX) 75 MG tablet TAKE ONE TABLET BY MOUTH ONCE A DAY     diazepam (VALIUM) 2 MG tablet Take 2 mg by mouth daily.     EPINEPHrine (EPIPEN IJ) Inject as directed as needed.     pantoprazole (PROTONIX) 40 MG tablet      rosuvastatin (CRESTOR) 40 MG tablet Take 40 mg by mouth daily.     No current facility-administered medications for this visit.    Past Medical History:  Diagnosis Date   Actinic keratosis 10/21/2019   above the R earlobe adjacent to the sup anti tragus (bx proven)   Dysplastic nevus 03/07/2018   L post shoulder    Past Surgical History:  Procedure Laterality Date   WISDOM TOOTH EXTRACTION       Social History   Tobacco Use   Smoking status: Never   Smokeless tobacco: Never  Substance Use Topics   Alcohol use: Yes   Drug use: Never     Family History  Problem Relation Age of Onset   Dementia Mother    Stroke Father      Allergies  Allergen Reactions   Aspirin     Other reaction(s): Angioedema   Sulfa Antibiotics Rash     REVIEW OF SYSTEMS (Negative unless checked)  Constitutional: '[]'$ Weight loss  '[]'$ Fever  '[]'$ Chills Cardiac: '[]'$ Chest pain   '[]'$ Chest pressure   '[]'$ Palpitations   '[]'$ Shortness of breath when laying flat    '[]'$ Shortness of breath at rest   '[]'$ Shortness of breath with exertion. Vascular:  '[]'$ Pain in legs with walking   '[]'$ Pain in legs at rest   '[]'$ Pain in legs when laying flat   '[]'$ Claudication   '[]'$ Pain in feet when walking  '[]'$ Pain in feet at rest  '[]'$ Pain in feet when laying flat   '[]'$ History of DVT   '[]'$ Phlebitis   '[]'$ Swelling in legs   '[]'$ Varicose veins   '[]'$ Non-healing ulcers Pulmonary:   '[]'$ Uses home oxygen   '[]'$ Productive cough   '[]'$ Hemoptysis   '[]'$ Wheeze  '[]'$ COPD   '[]'$ Asthma Neurologic:  '[]'$ Dizziness  '[]'$ Blackouts   '[]'$ Seizures   '[]'$ History of stroke   '[]'$ History of TIA  '[]'$ Aphasia   '[]'$ Temporary blindness   '[]'$ Dysphagia   '[]'$ Weakness or numbness in arms   '[]'$ Weakness or numbness in legs Musculoskeletal:  '[]'$ Arthritis   '[]'$ Joint swelling   '[]'$ Joint pain   '[]'$ Low back pain Hematologic:  '[]'$ Easy bruising  '[]'$ Easy bleeding   '[]'$ Hypercoagulable state   '[]'$ Anemic   Gastrointestinal:  '[]'$ Blood in stool   '[]'$ Vomiting blood  '[x]'$ Gastroesophageal reflux/heartburn   '[]'$ Abdominal pain Genitourinary:  '[]'$ Chronic kidney disease   '[]'$ Difficult urination  '[]'$ Frequent urination  '[]'$ Burning with urination   '[]'$ Hematuria Skin:  '[]'$ Rashes   '[]'$ Ulcers   '[]'$ Wounds Psychological:  '[]'$ History of anxiety   '[]'$   History of major depression.  Physical Examination  BP (!) 150/85 (BP Location: Right Arm)   Pulse (!) 57   Resp 18   Ht 6' (1.829 m)   Wt 177 lb 3.2 oz (80.4 kg)   BMI 24.03 kg/m  Gen:  WD/WN, NAD. Appears younger than stated age. Head: [place]/AT, No temporalis wasting. Ear/Nose/Throat: Hearing grossly intact, nares w/o erythema or drainage Eyes: Conjunctiva clear. Sclera non-icteric Neck: Supple.  Trachea midline Pulmonary:  Good air movement, no use of accessory muscles.  Cardiac: RRR, no JVD Vascular:  Vessel Right Left  Radial Palpable Palpable               Musculoskeletal: M/S 5/5 throughout.  No deformity or atrophy. No edema. Neurologic: Sensation grossly intact in extremities.  Symmetrical.  Speech is fluent.  Psychiatric: Judgment  intact, Mood & affect appropriate for pt's clinical situation. Dermatologic: No rashes or ulcers noted.  No cellulitis or open wounds.      Labs No results found for this or any previous visit (from the past 2160 hour(s)).  Radiology No results found.  Assessment/Plan  Hypertension, essential blood pressure control important in reducing the progression of atherosclerotic disease. On appropriate oral medications.   Hyperlipidemia lipid control important in reducing the progression of atherosclerotic disease. Continue statin therapy   Carotid artery disease (HCC) Carotid duplex today demonstrates 1 to 39% right ICA stenosis with common carotid disease of less than 50% and no significant left carotid stenosis.  On Plavix and a statin agent.  No role for intervention at this level.  Recheck in 1 year.    Leotis Pain, MD  07/05/2022 2:07 PM    This note was created with Dragon medical transcription system.  Any errors from dictation are purely unintentional

## 2022-07-05 NOTE — Assessment & Plan Note (Signed)
blood pressure control important in reducing the progression of atherosclerotic disease. On appropriate oral medications.  

## 2022-07-19 DIAGNOSIS — H00014 Hordeolum externum left upper eyelid: Secondary | ICD-10-CM | POA: Diagnosis not present

## 2022-11-10 DIAGNOSIS — N1831 Chronic kidney disease, stage 3a: Secondary | ICD-10-CM | POA: Diagnosis not present

## 2022-11-10 DIAGNOSIS — I10 Essential (primary) hypertension: Secondary | ICD-10-CM | POA: Diagnosis not present

## 2022-11-10 DIAGNOSIS — Z125 Encounter for screening for malignant neoplasm of prostate: Secondary | ICD-10-CM | POA: Diagnosis not present

## 2022-11-10 DIAGNOSIS — E7800 Pure hypercholesterolemia, unspecified: Secondary | ICD-10-CM | POA: Diagnosis not present

## 2022-11-17 DIAGNOSIS — N1831 Chronic kidney disease, stage 3a: Secondary | ICD-10-CM | POA: Diagnosis not present

## 2022-11-17 DIAGNOSIS — I739 Peripheral vascular disease, unspecified: Secondary | ICD-10-CM | POA: Diagnosis not present

## 2022-11-17 DIAGNOSIS — I10 Essential (primary) hypertension: Secondary | ICD-10-CM | POA: Diagnosis not present

## 2022-11-17 DIAGNOSIS — Z0000 Encounter for general adult medical examination without abnormal findings: Secondary | ICD-10-CM | POA: Diagnosis not present

## 2022-11-17 DIAGNOSIS — I779 Disorder of arteries and arterioles, unspecified: Secondary | ICD-10-CM | POA: Diagnosis not present

## 2022-11-28 DIAGNOSIS — K219 Gastro-esophageal reflux disease without esophagitis: Secondary | ICD-10-CM | POA: Diagnosis not present

## 2022-11-28 DIAGNOSIS — J301 Allergic rhinitis due to pollen: Secondary | ICD-10-CM | POA: Diagnosis not present

## 2022-11-28 DIAGNOSIS — H6123 Impacted cerumen, bilateral: Secondary | ICD-10-CM | POA: Diagnosis not present

## 2023-02-08 ENCOUNTER — Ambulatory Visit: Payer: PPO | Admitting: Dermatology

## 2023-02-08 VITALS — BP 117/71 | HR 58

## 2023-02-08 DIAGNOSIS — R238 Other skin changes: Secondary | ICD-10-CM | POA: Diagnosis not present

## 2023-02-08 DIAGNOSIS — L729 Follicular cyst of the skin and subcutaneous tissue, unspecified: Secondary | ICD-10-CM

## 2023-02-08 DIAGNOSIS — L578 Other skin changes due to chronic exposure to nonionizing radiation: Secondary | ICD-10-CM | POA: Diagnosis not present

## 2023-02-08 DIAGNOSIS — L821 Other seborrheic keratosis: Secondary | ICD-10-CM | POA: Diagnosis not present

## 2023-02-08 DIAGNOSIS — L720 Epidermal cyst: Secondary | ICD-10-CM | POA: Diagnosis not present

## 2023-02-08 DIAGNOSIS — W908XXA Exposure to other nonionizing radiation, initial encounter: Secondary | ICD-10-CM

## 2023-02-08 DIAGNOSIS — D229 Melanocytic nevi, unspecified: Secondary | ICD-10-CM

## 2023-02-08 NOTE — Progress Notes (Signed)
   Follow-Up Visit   Subjective  Jordan Leach. is a 72 y.o. male who presents for the following: Several spots to check on the neck, back, and face. Spot on the neck was bigger when his appt was made, but as since gone down. Not itchy or bothersome.   The patient has spots, moles and lesions to be evaluated, some may be new or changing and the patient has concerns that these could be cancer.   The following portions of the chart were reviewed this encounter and updated as appropriate: medications, allergies, medical history  Review of Systems:  No other skin or systemic complaints except as noted in HPI or Assessment and Plan.  Objective  Well appearing patient in no apparent distress; mood and affect are within normal limits.  A focused examination was performed of the following areas: Face, back Relevant physical exam findings are noted in the Assessment and Plan.    Assessment & Plan   ACTINIC DAMAGE - chronic, secondary to cumulative UV radiation exposure/sun exposure over time - diffuse scaly erythematous macules with underlying dyspigmentation - Recommend daily broad spectrum sunscreen SPF 30+ to sun-exposed areas, reapply every 2 hours as needed.  - Recommend staying in the shade or wearing long sleeves, sun glasses (UVA+UVB protection) and wide brim hats (4-inch brim around the entire circumference of the hat). - Call for new or changing lesions.   MELANOCYTIC NEVI vs SK Exam: Tan-brown and/or pink-flesh-colored symmetric macules and papules  3.0 mm two-toned brown macule, darker inferior of the spinal mid back, slightly waxy  Treatment Plan: Benign appearing on exam today. Recommend observation. Call clinic for new or changing moles. Recommend daily use of broad spectrum spf 30+ sunscreen to sun-exposed areas.  Recheck on f/up  SEBORRHEIC KERATOSIS - Stuck-on, waxy, tan-brown papules and/or plaques, including left jaw, back  - Benign-appearing - Discussed  benign etiology and prognosis. - Observe - Call for any changes  EPIDERMAL INCLUSION CYST, h/o inflamed (neck) Exam: Violaceous tan firm papule on the right anterior neck and left axilla   Benign-appearing. Exam most consistent with an epidermal inclusion cyst. Discussed that a cyst is a benign growth that can grow over time and sometimes get irritated or inflamed. Recommend observation if it is not bothersome. Discussed option of surgical excision to remove it if it is growing, symptomatic, or other changes noted. Please call for new or changing lesions so they can be evaluated. Recheck on f/up  Effaclar Duo Benzoyl Peroxide sample given - apply to AA once daily.  Benzoyl peroxide can cause dryness and irritation of the skin. It can also bleach fabric. When used together with Aczone (dapsone) cream, it can stain the skin orange.  Return as scheduled with Dr Verdell Face, Cherlyn Labella, CMA, am acting as scribe for Willeen Niece, MD .   Documentation: I have reviewed the above documentation for accuracy and completeness, and I agree with the above.  Willeen Niece, MD

## 2023-02-08 NOTE — Patient Instructions (Addendum)
Seborrheic Keratosis (left jaw)  What causes seborrheic keratoses? Seborrheic keratoses are harmless, common skin growths that first appear during adult life.  As time goes by, more growths appear.  Some people may develop a large number of them.  Seborrheic keratoses appear on both covered and uncovered body parts.  They are not caused by sunlight.  The tendency to develop seborrheic keratoses can be inherited.  They vary in color from skin-colored to gray, brown, or even black.  They can be either smooth or have a rough, warty surface.   Seborrheic keratoses are superficial and look as if they were stuck on the skin.  Under the microscope this type of keratosis looks like layers upon layers of skin.  That is why at times the top layer may seem to fall off, but the rest of the growth remains and re-grows.    Treatment Seborrheic keratoses do not need to be treated, but can easily be removed in the office.  Seborrheic keratoses often cause symptoms when they rub on clothing or jewelry.  Lesions can be in the way of shaving.  If they become inflamed, they can cause itching, soreness, or burning.  Removal of a seborrheic keratosis can be accomplished by freezing, burning, or surgery. If any spot bleeds, scabs, or grows rapidly, please return to have it checked, as these can be an indication of a skin cancer.  Due to recent changes in healthcare laws, you may see results of your pathology and/or laboratory studies on MyChart before the doctors have had a chance to review them. We understand that in some cases there may be results that are confusing or concerning to you. Please understand that not all results are received at the same time and often the doctors may need to interpret multiple results in order to provide you with the best plan of care or course of treatment. Therefore, we ask that you please give [place] 2 business days to thoroughly review all your results before contacting the office for  clarification. Should we see a critical lab result, you will be contacted sooner.   If You Need Anything After Your Visit  If you have any questions or concerns for your doctor, please call our main line at [phone] and press option 4 to reach your doctor's medical assistant. If no one answers, please leave a voicemail as directed and we will return your call as soon as possible. Messages left after 4 pm will be answered the following business day.   You may also send [place] a message via MyChart. We typically respond to MyChart messages within 1-2 business days.  For prescription refills, please ask your pharmacy to contact our office. Our fax number is [phone].  If you have an urgent issue when the clinic is closed that cannot wait until the next business day, you can page your doctor at the number below.    Please note that while we do our best to be available for urgent issues outside of office hours, we are not available 24/7.   If you have an urgent issue and are unable to reach [place], you may choose to seek medical care at your doctor's office, retail clinic, urgent care center, or emergency room.  If you have a medical emergency, please immediately call 911 or go to the emergency department.  Pager Numbers  - Dr. Gwen Pounds: [phone]  - Dr. Neale Burly: [phone]  - Dr. Roseanne Reno: [phone]  In the event of inclement weather, please call our main  line at [phone] for an update on the status of any delays or closures.  Dermatology Medication Tips: Please keep the boxes that topical medications come in in order to help keep track of the instructions about where and how to use these. Pharmacies typically print the medication instructions only on the boxes and not directly on the medication tubes.   If your medication is too expensive, please contact our office at [phone] option 4 or send [place] a message through [place].   We are unable to tell what your co-pay for  medications will be in advance as this is different depending on your insurance coverage. However, we may be able to find a substitute medication at lower cost or fill out paperwork to get insurance to cover a needed medication.   If a prior authorization is required to get your medication covered by your insurance company, please allow [place] 1-2 business days to complete this process.  Drug prices often vary depending on where the prescription is filled and some pharmacies may offer cheaper prices.  The website www.goodrx.com contains coupons for medications through different pharmacies. The prices here do not account for what the cost may be with help from insurance (it may be cheaper with your insurance), but the website can give you the price if you did not use any insurance.  - You can print the associated coupon and take it with your prescription to the pharmacy.  - You may also stop by our office during regular business hours and pick up a GoodRx coupon card.  - If you need your prescription sent electronically to a different pharmacy, notify our office through [facility] or by phone at [phone] option 4.     Si Usted Necesita Algo Despus de Su Visita  Tambin puede enviarnos un mensaje a travs de Pharmacist, community. Por lo general respondemos a los mensajes de MyChart en el transcurso de 1 a 2 das hbiles.  Para renovar recetas, por favor pida a su farmacia que se ponga en contacto con nuestra oficina. Harland Dingwall de fax es [place] [phone].  Si tiene un asunto urgente cuando la clnica est cerrada y que no puede esperar hasta el siguiente da hbil, puede llamar/localizar a su doctor(a) al nmero que aparece a continuacin.   Por favor, tenga en cuenta que aunque hacemos todo lo posible para estar disponibles para asuntos urgentes fuera del horario de [place], no estamos disponibles las 24 horas del da, los 7 das de la [place].   Si tiene un problema urgente y no puede  comunicarse con nosotros, puede optar por buscar atencin mdica  en el consultorio de su doctor(a), en una clnica privada, en un centro de atencin urgente o en una sala de emergencias.  Si tiene Engineering geologist, por favor llame inmediatamente al 911 o vaya a la sala de emergencias.  Nmeros de bper  - Dr. Nehemiah Massed: [phone]  - Dra. Moye: [phone]  - Dra. Nicole Kindred: [phone]  En caso de inclemencias del [place], por favor llame a Johnsie Kindred principal al [phone] para una actualizacin sobre el [place] de cualquier retraso o cierre.  Consejos para la medicacin en dermatologa: Por favor, guarde las cajas en las que vienen los medicamentos de uso tpico para ayudarle a seguir las instrucciones sobre dnde y cmo usarlos. Las farmacias generalmente imprimen las instrucciones del medicamento slo en las cajas y no directamente en los tubos del [place].   Si su medicamento es [employer], por favor, pngase en  contacto con Zigmund Daniel llamando al [phone] y presione la opcin 4 o envenos un mensaje a travs de Pharmacist, community.   No podemos decirle cul ser su copago por los medicamentos por adelantado ya que esto es diferente dependiendo de la cobertura de su seguro. Sin embargo, es posible que podamos encontrar un medicamento sustituto a Electrical engineer un formulario para que el seguro cubra el medicamento que se considera necesario.   Si se requiere una autorizacin previa para que su compaa de seguros [place] su medicamento, por favor permtanos de 1 a 2 das hbiles para completar este proceso.  Los precios de los medicamentos varan con frecuencia dependiendo del Environmental consultant de dnde se surte la receta y alguna farmacias pueden ofrecer precios ms baratos.  El sitio web www.goodrx.com tiene cupones para medicamentos de Airline pilot. Los precios aqu no tienen en cuenta lo que podra costar con la ayuda del seguro (puede ser ms barato con su seguro), pero  el sitio web puede darle el precio si no utiliz Research scientist (physical sciences).  - Puede imprimir el cupn correspondiente y llevarlo con su receta a la farmacia.  - Tambin puede pasar por nuestra oficina durante el horario de atencin regular y Charity fundraiser una tarjeta de cupones de GoodRx.  - Si necesita que su receta se enve electrnicamente a una farmacia diferente, informe a nuestra oficina a travs de MyChart de [place] o por telfono llamando al [phone] y presione la opcin 4.

## 2023-04-19 ENCOUNTER — Ambulatory Visit: Payer: PPO | Admitting: Dermatology

## 2023-04-19 ENCOUNTER — Encounter: Payer: Self-pay | Admitting: Dermatology

## 2023-04-19 VITALS — BP 127/72 | HR 61

## 2023-04-19 DIAGNOSIS — Z86018 Personal history of other benign neoplasm: Secondary | ICD-10-CM | POA: Diagnosis not present

## 2023-04-19 DIAGNOSIS — L814 Other melanin hyperpigmentation: Secondary | ICD-10-CM | POA: Diagnosis not present

## 2023-04-19 DIAGNOSIS — D229 Melanocytic nevi, unspecified: Secondary | ICD-10-CM

## 2023-04-19 DIAGNOSIS — Z1283 Encounter for screening for malignant neoplasm of skin: Secondary | ICD-10-CM | POA: Diagnosis not present

## 2023-04-19 DIAGNOSIS — L578 Other skin changes due to chronic exposure to nonionizing radiation: Secondary | ICD-10-CM

## 2023-04-19 NOTE — Progress Notes (Signed)
   Follow-Up Visit   Subjective  Jordan Leach. is a 72 y.o. male who presents for the following: Skin Cancer Screening and Full Body Skin Exam The patient presents for Total-Body Skin Exam (TBSE) for skin cancer screening and mole check. The patient has spots, moles and lesions to be evaluated, some may be new or changing and the patient may have concern these could be cancer.  The following portions of the chart were reviewed this encounter and updated as appropriate: medications, allergies, medical history  Review of Systems:  No other skin or systemic complaints except as noted in HPI or Assessment and Plan.  Objective  Well appearing patient in no apparent distress; mood and affect are within normal limits.  A full examination was performed including scalp, head, eyes, ears, nose, lips, neck, chest, axillae, abdomen, back, buttocks, bilateral upper extremities, bilateral lower extremities, hands, feet, fingers, toes, fingernails, and toenails. All findings within normal limits unless otherwise noted below.   Relevant physical exam findings are noted in the Assessment and Plan.   Assessment & Plan   SKIN CANCER SCREENING PERFORMED TODAY.  ACTINIC DAMAGE - Chronic condition, secondary to cumulative UV/sun exposure - diffuse scaly erythematous macules with underlying dyspigmentation - Recommend daily broad spectrum sunscreen SPF 30+ to sun-exposed areas, reapply every 2 hours as needed.  - Staying in the shade or wearing long sleeves, sun glasses (UVA+UVB protection) and wide brim hats (4-inch brim around the entire circumference of the hat) are also recommended for sun protection.  - Call for new or changing lesions.  LENTIGINES, SEBORRHEIC KERATOSES, HEMANGIOMAS - Benign normal skin lesions - Benign-appearing - Call for any changes  MELANOCYTIC NEVI - Tan-brown and/or pink-flesh-colored symmetric macules and papules - Benign appearing on exam today - Observation - Call  clinic for new or changing moles - Recommend daily use of broad spectrum spf 30+ sunscreen to sun-exposed areas.   HISTORY OF DYSPLASTIC NEVUS - L post shoulder, moderate with halo effect, 03/07/2018 No evidence of recurrence today Recommend regular full body skin exams Recommend daily broad spectrum sunscreen SPF 30+ to sun-exposed areas, reapply every 2 hours as needed.  Call if any new or changing lesions are noted between office visits  Return in about 1 year (around 04/18/2024) for TBSE - hx dysplastic nevi, AKs.  I, Cari Caraway, CMA, am acting as scribe for Armida Sans, MD .   Documentation: I have reviewed the above documentation for accuracy and completeness, and I agree with the above.  Armida Sans, MD

## 2023-04-19 NOTE — Patient Instructions (Signed)

## 2023-06-01 DIAGNOSIS — Z1211 Encounter for screening for malignant neoplasm of colon: Secondary | ICD-10-CM | POA: Diagnosis not present

## 2023-06-01 DIAGNOSIS — Z7902 Long term (current) use of antithrombotics/antiplatelets: Secondary | ICD-10-CM | POA: Diagnosis not present

## 2023-06-29 ENCOUNTER — Other Ambulatory Visit (INDEPENDENT_AMBULATORY_CARE_PROVIDER_SITE_OTHER): Payer: Self-pay | Admitting: Vascular Surgery

## 2023-06-29 DIAGNOSIS — I6523 Occlusion and stenosis of bilateral carotid arteries: Secondary | ICD-10-CM

## 2023-07-04 ENCOUNTER — Ambulatory Visit (INDEPENDENT_AMBULATORY_CARE_PROVIDER_SITE_OTHER): Payer: PPO

## 2023-07-04 ENCOUNTER — Encounter (INDEPENDENT_AMBULATORY_CARE_PROVIDER_SITE_OTHER): Payer: Self-pay | Admitting: Vascular Surgery

## 2023-07-04 ENCOUNTER — Ambulatory Visit (INDEPENDENT_AMBULATORY_CARE_PROVIDER_SITE_OTHER): Payer: PPO | Admitting: Vascular Surgery

## 2023-07-04 VITALS — BP 131/79 | HR 60 | Resp 16 | Wt 182.6 lb

## 2023-07-04 DIAGNOSIS — E785 Hyperlipidemia, unspecified: Secondary | ICD-10-CM

## 2023-07-04 DIAGNOSIS — I6521 Occlusion and stenosis of right carotid artery: Secondary | ICD-10-CM

## 2023-07-04 DIAGNOSIS — I6523 Occlusion and stenosis of bilateral carotid arteries: Secondary | ICD-10-CM

## 2023-07-04 DIAGNOSIS — I10 Essential (primary) hypertension: Secondary | ICD-10-CM

## 2023-07-04 NOTE — Progress Notes (Signed)
MRN : [id number]  Jordan Leach. is a 72 y.o. ([birth year]) male who presents with chief complaint of  Chief Complaint  Patient presents with   Follow-up    86yr carotid follow up  .  History of Present Illness: Patient returns in follow-up of his carotid disease.  He is doing well.  He denies any current symptoms or problems related to his carotid disease. Specifically, the patient denies amaurosis fugax, speech or swallowing difficulties, or arm or leg weakness or numbness.  Duplex today shows minimal wall thickening and plaque bilaterally significant with near normal carotid artery findings on both sides.  Current Outpatient Medications  Medication Sig Dispense Refill   carvedilol (COREG) 25 MG tablet Take 25 mg by mouth 2 (two) times daily.     clopidogrel (PLAVIX) 75 MG tablet TAKE ONE TABLET BY MOUTH ONCE A DAY     diazepam (VALIUM) 2 MG tablet Take 2 mg by mouth daily.     EPINEPHrine (EPIPEN IJ) Inject as directed as needed.     pantoprazole (PROTONIX) 40 MG tablet      rosuvastatin (CRESTOR) 40 MG tablet Take 40 mg by mouth daily.     No current facility-administered medications for this visit.    Past Medical History:  Diagnosis Date   Actinic keratosis 10/21/2019   above the R earlobe adjacent to the sup anti tragus (bx proven)   Dysplastic nevus 03/07/2018   L post shoulder, mod with halo effect    Past Surgical History:  Procedure Laterality Date   WISDOM TOOTH EXTRACTION       Social History   Tobacco Use   Smoking status: Never   Smokeless tobacco: Never  Substance Use Topics   Alcohol use: Yes   Drug use: Never       Family History  Problem Relation Age of Onset   Dementia Mother    Stroke Father      Allergies  Allergen Reactions   Aspirin     Other reaction(s): Angioedema   Sulfa Antibiotics Rash     REVIEW OF SYSTEMS (Negative unless checked)  Constitutional: [] Weight loss  [] Fever  [] Chills Cardiac: [] Chest pain   [] Chest  pressure   [] Palpitations   [] Shortness of breath when laying flat   [] Shortness of breath at rest   [] Shortness of breath with exertion. Vascular:  [] Pain in legs with walking   [] Pain in legs at rest   [] Pain in legs when laying flat   [] Claudication   [] Pain in feet when walking  [] Pain in feet at rest  [] Pain in feet when laying flat   [] History of DVT   [] Phlebitis   [] Swelling in legs   [] Varicose veins   [] Non-healing ulcers Pulmonary:   [] Uses home oxygen   [] Productive cough   [] Hemoptysis   [] Wheeze  [] COPD   [] Asthma Neurologic:  [] Dizziness  [] Blackouts   [] Seizures   [] History of stroke   [] History of TIA  [] Aphasia   [] Temporary blindness   [] Dysphagia   [] Weakness or numbness in arms   [] Weakness or numbness in legs Musculoskeletal:  [] Arthritis   [] Joint swelling   [] Joint pain   [] Low back pain Hematologic:  [] Easy bruising  [] Easy bleeding   [] Hypercoagulable state   [] Anemic  [] Hepatitis Gastrointestinal:  [] Blood in stool   [] Vomiting blood  [x] Gastroesophageal reflux/heartburn   [] Difficulty swallowing. Genitourinary:  [] Chronic kidney disease   [] Difficult urination  [] Frequent urination  [] Burning with urination   [] Blood  in urine Skin:  [] Rashes   [] Ulcers   [] Wounds Psychological:  [] History of anxiety   []  History of major depression.  Physical Examination  Vitals:   07/04/23 1031  BP: 131/79  Pulse: 60  Resp: 16  Weight: 182 lb 9.6 oz (82.8 kg)   Body mass index is 24.77 kg/m. Gen:  WD/WN, NAD.  Appears younger than stated age Head: [place]/AT, No temporalis wasting. Ear/Nose/Throat: Hearing grossly intact, nares w/o erythema or drainage, trachea midline Eyes: Conjunctiva clear. Sclera non-icteric Neck: Supple.  No bruit  Pulmonary:  Good air movement, equal and clear to auscultation bilaterally.  Cardiac: RRR, No JVD Vascular:  Vessel Right Left  Radial Palpable Palpable               Musculoskeletal: M/S 5/5 throughout.  No deformity or atrophy.  No  edema. Neurologic: CN 2-12 intact. Sensation grossly intact in extremities.  Symmetrical.  Speech is fluent. Motor exam as listed above. Psychiatric: Judgment intact, Mood & affect appropriate for pt's clinical situation. Dermatologic: No rashes or ulcers noted.  No cellulitis or open wounds.     CBC No results found for: "WBC", "HGB", "HCT", "MCV", "PLT"  BMET No results found for: "NA", "K", "CL", "CO2", "GLUCOSE", "BUN", "CREATININE", "CALCIUM", "GFRNONAA", "GFRAA" CrCl cannot be calculated (No successful lab value found.).  COAG No results found for: "INR", "PROTIME"  Radiology No results found.   Assessment/Plan Carotid stenosis, asymptomatic, right Duplex today shows minimal wall thickening and plaque bilaterally significant with near normal carotid artery findings on both sides.  He has previously had more significant carotid disease on the right so the Plavix and Crestor seem to be doing a good job of keeping this under control and actually seeing some regression.  I think we can go to checking this every other year at this point.  Hypertension, essential blood pressure control important in reducing the progression of atherosclerotic disease. On appropriate oral medications.     Hyperlipidemia lipid control important in reducing the progression of atherosclerotic disease. Continue statin therapy  Festus Barren, MD  07/04/2023 11:27 AM    This note was created with Dragon medical transcription system.  Any errors from dictation are purely unintentional

## 2023-07-04 NOTE — Assessment & Plan Note (Signed)
Duplex today shows minimal wall thickening and plaque bilaterally significant with near normal carotid artery findings on both sides.  He has previously had more significant carotid disease on the right so the Plavix and Crestor seem to be doing a good job of keeping this under control and actually seeing some regression.  I think we can go to checking this every other year at this point.

## 2023-08-11 ENCOUNTER — Encounter: Payer: Self-pay | Admitting: Gastroenterology

## 2023-08-14 ENCOUNTER — Ambulatory Visit: Payer: PPO | Admitting: Podiatry

## 2023-08-28 ENCOUNTER — Ambulatory Visit: Payer: PPO | Admitting: Podiatry

## 2023-08-30 NOTE — H&P (Signed)
Pre-Procedure H&P   Patient ID: Jordan Leach. is a 73 y.o. male.  Gastroenterology Provider: Jaynie Collins, DO  Referring Provider: Tawni Pummel, PA PCP: Lauro Regulus, MD  Date: 08/31/2023  HPI Mr. Jordan Leach. is a 73 y.o. male who presents today for Colonoscopy for Colorectal cancer screening .  Reports occasional loose stool 1 time a month.  Otherwise regular bowel movements.  He does appreciate urgency and spasm with specific foods.  No melena or hematochezia.  Appetite and weight stable.  No family history of colon cancer or colon polyps. Last underwent colonoscopy in 2014 was reportedly normal in the setting of fair prep. Creatinine 1.1  On Plavix which has been held for this procedure (last dose on 08/25/23)   Past Medical History:  Diagnosis Date   Actinic keratosis 10/21/2019   above the R earlobe adjacent to the sup anti tragus (bx proven)   Carotid artery disease (HCC)    Chronic kidney disease    Dysplastic nevus 03/07/2018   L post shoulder, mod with halo effect   Hyperlipidemia    Hypertension    Peripheral vascular disease (HCC)     Past Surgical History:  Procedure Laterality Date   COLONOSCOPY     WISDOM TOOTH EXTRACTION      Family History No h/o GI disease or malignancy  Review of Systems  Constitutional:  Negative for activity change, appetite change, chills, diaphoresis, fatigue, fever and unexpected weight change.  HENT:  Negative for trouble swallowing and voice change.   Respiratory:  Negative for shortness of breath and wheezing.   Cardiovascular:  Negative for chest pain, palpitations and leg swelling.  Gastrointestinal:  Negative for abdominal distention, abdominal pain, anal bleeding, blood in stool, constipation, diarrhea, nausea and vomiting.  Musculoskeletal:  Negative for arthralgias and myalgias.  Skin:  Negative for color change and pallor.  Neurological:  Negative for dizziness, syncope and  weakness.  Psychiatric/Behavioral:  Negative for confusion. The patient is not nervous/anxious.   All other systems reviewed and are negative.    Medications No current facility-administered medications on file prior to encounter.   Current Outpatient Medications on File Prior to Encounter  Medication Sig Dispense Refill   carvedilol (COREG) 25 MG tablet Take 25 mg by mouth 2 (two) times daily.     diazepam (VALIUM) 2 MG tablet Take 2 mg by mouth daily.     pantoprazole (PROTONIX) 40 MG tablet      rosuvastatin (CRESTOR) 40 MG tablet Take 40 mg by mouth daily.     clopidogrel (PLAVIX) 75 MG tablet TAKE ONE TABLET BY MOUTH ONCE A DAY     EPINEPHrine (EPIPEN IJ) Inject as directed as needed.      Pertinent medications related to GI and procedure were reviewed by me with the patient prior to the procedure   Current Facility-Administered Medications:    0.9 %  sodium chloride infusion, , Intravenous, Continuous, Jaynie Collins, DO, Last Rate: 20 mL/hr at 08/31/23 0703, New Bag at 08/31/23 0703  sodium chloride 20 mL/hr at 08/31/23 0703       Allergies  Allergen Reactions   Aspirin     Other reaction(s): Angioedema   Sulfa Antibiotics Rash   Allergies were reviewed by me prior to the procedure  Objective   Body mass index is 23.95 kg/m. Vitals:   08/31/23 0658  BP: (!) 156/76  Pulse: (!) 50  Temp: 97.6 F (36.4 C)  TempSrc: Temporal  SpO2: 100%  Weight: 80.1 kg    Physical Exam Vitals and nursing note reviewed.  Constitutional:      General: He is not in acute distress.    Appearance: Normal appearance. He is not ill-appearing, toxic-appearing or diaphoretic.  HENT:     Head: Normocephalic and atraumatic.     Nose: Nose normal.     Mouth/Throat:     Mouth: Mucous membranes are moist.     Pharynx: Oropharynx is clear.  Eyes:     General: No scleral icterus.    Extraocular Movements: Extraocular movements intact.  Cardiovascular:     Rate and Rhythm:  Regular rhythm. Bradycardia present.     Heart sounds: Normal heart sounds. No murmur heard.    No friction rub. No gallop.  Pulmonary:     Effort: Pulmonary effort is normal. No respiratory distress.     Breath sounds: Normal breath sounds. No wheezing, rhonchi or rales.  Abdominal:     General: Bowel sounds are normal. There is no distension.     Palpations: Abdomen is soft.     Tenderness: There is no abdominal tenderness. There is no guarding or rebound.  Musculoskeletal:     Cervical back: Neck supple.     Right lower leg: No edema.     Left lower leg: No edema.  Skin:    General: Skin is warm and dry.     Coloration: Skin is not jaundiced or pale.  Neurological:     General: No focal deficit present.     Mental Status: He is alert and oriented to person, place, and time. Mental status is at baseline.  Psychiatric:        Mood and Affect: Mood normal.        Behavior: Behavior normal.        Thought Content: Thought content normal.        Judgment: Judgment normal.      Assessment:  Mr. Jordan Leach. is a 73 y.o. male  who presents today for Colonoscopy for Colorectal cancer screening .  Plan:  Colonoscopy with possible intervention today  Colonoscopy with possible biopsy, control of bleeding, polypectomy, and interventions as necessary has been discussed with the patient/patient representative. Informed consent was obtained from the patient/patient representative after explaining the indication, nature, and risks of the procedure including but not limited to death, bleeding, perforation, missed neoplasm/lesions, cardiorespiratory compromise, and reaction to medications. Opportunity for questions was given and appropriate answers were provided. Patient/patient representative has verbalized understanding is amenable to undergoing the procedure.   Jaynie Collins, DO  [facility]  Portions of the record may have been created with voice  recognition software. Occasional wrong-word or 'sound-a-like' substitutions may have occurred due to the inherent limitations of voice recognition software.  Read the chart carefully and recognize, using context, where substitutions may have occurred.

## 2023-08-31 ENCOUNTER — Ambulatory Visit: Payer: Medicare Other | Admitting: Anesthesiology

## 2023-08-31 ENCOUNTER — Other Ambulatory Visit: Payer: Self-pay

## 2023-08-31 ENCOUNTER — Encounter
Admission: RE | Disposition: A | Discharge status: Home / Self Care | Payer: Self-pay | Source: Home / Self Care | Attending: Gastroenterology

## 2023-08-31 ENCOUNTER — Encounter: Payer: Self-pay | Admitting: Gastroenterology

## 2023-08-31 ENCOUNTER — Ambulatory Visit
Admission: RE | Admit: 2023-08-31 | Discharge: 2023-08-31 | Disposition: A | Discharge status: Home / Self Care | Payer: Medicare Other | Attending: Gastroenterology | Admitting: Gastroenterology

## 2023-08-31 DIAGNOSIS — Z7902 Long term (current) use of antithrombotics/antiplatelets: Secondary | ICD-10-CM | POA: Diagnosis not present

## 2023-08-31 DIAGNOSIS — K5730 Diverticulosis of large intestine without perforation or abscess without bleeding: Secondary | ICD-10-CM | POA: Insufficient documentation

## 2023-08-31 DIAGNOSIS — D125 Benign neoplasm of sigmoid colon: Secondary | ICD-10-CM | POA: Diagnosis not present

## 2023-08-31 DIAGNOSIS — Z1211 Encounter for screening for malignant neoplasm of colon: Secondary | ICD-10-CM | POA: Insufficient documentation

## 2023-08-31 DIAGNOSIS — K641 Second degree hemorrhoids: Secondary | ICD-10-CM | POA: Insufficient documentation

## 2023-08-31 DIAGNOSIS — I739 Peripheral vascular disease, unspecified: Secondary | ICD-10-CM | POA: Diagnosis not present

## 2023-08-31 DIAGNOSIS — N189 Chronic kidney disease, unspecified: Secondary | ICD-10-CM | POA: Diagnosis not present

## 2023-08-31 DIAGNOSIS — I129 Hypertensive chronic kidney disease with stage 1 through stage 4 chronic kidney disease, or unspecified chronic kidney disease: Secondary | ICD-10-CM | POA: Diagnosis not present

## 2023-08-31 HISTORY — DX: Hyperlipidemia, unspecified: E78.5

## 2023-08-31 HISTORY — DX: Disorder of arteries and arterioles, unspecified: I77.9

## 2023-08-31 HISTORY — PX: POLYPECTOMY: SHX5525

## 2023-08-31 HISTORY — DX: Chronic kidney disease, unspecified: N18.9

## 2023-08-31 HISTORY — DX: Essential (primary) hypertension: I10

## 2023-08-31 HISTORY — PX: COLONOSCOPY WITH PROPOFOL: SHX5780

## 2023-08-31 HISTORY — DX: Peripheral vascular disease, unspecified: I73.9

## 2023-08-31 SURGERY — COLONOSCOPY WITH PROPOFOL
Anesthesia: Monitor Anesthesia Care

## 2023-08-31 MED ORDER — PROPOFOL 10 MG/ML IV BOLUS
INTRAVENOUS | Status: DC | PRN
  Administered 2023-08-31: 80 mg via INTRAVENOUS
  Administered 2023-08-31: 30 mg via INTRAVENOUS
  Administered 2023-08-31: 10 mg via INTRAVENOUS
  Administered 2023-08-31: 20 mg via INTRAVENOUS
  Administered 2023-08-31: 10 mg via INTRAVENOUS
  Administered 2023-08-31 (×2): 20 mg via INTRAVENOUS

## 2023-08-31 MED ORDER — LIDOCAINE HCL (PF) 2 % IJ SOLN
INTRAMUSCULAR | Status: AC
  Filled 2023-08-31: qty 5

## 2023-08-31 MED ORDER — SODIUM CHLORIDE 0.9 % IV SOLN: INTRAVENOUS | Status: DC

## 2023-08-31 MED ORDER — PROPOFOL 1000 MG/100ML IV EMUL
INTRAVENOUS | Status: AC
  Filled 2023-08-31: qty 100

## 2023-08-31 MED ORDER — PROPOFOL 10 MG/ML IV BOLUS
INTRAVENOUS | Status: AC
  Filled 2023-08-31: qty 20

## 2023-08-31 MED ORDER — LIDOCAINE HCL (CARDIAC) PF 100 MG/5ML IV SOSY
PREFILLED_SYRINGE | INTRAVENOUS | Status: DC | PRN
  Administered 2023-08-31: 60 mg via INTRAVENOUS

## 2023-08-31 NOTE — Transfer of Care (Signed)
Immediate Anesthesia Transfer of Care Note  Patient: Jordan Leach.  Procedure(s) Performed: COLONOSCOPY WITH PROPOFOL  Patient Location: PACU  Anesthesia Type:MAC  Level of Consciousness: awake, alert , oriented, and patient cooperative  Airway & Oxygen Therapy: Patient Spontanous Breathing  Post-op Assessment: Report given to RN and Post -op Vital signs reviewed and stable  Post vital signs: Reviewed and stable  Last Vitals:  Vitals Value Taken Time  BP 115/70 08/31/23 0801  Temp 35.7 C 08/31/23 0801  Pulse 52 08/31/23 0808  Resp 18 08/31/23 0808  SpO2 98 % 08/31/23 0808  Vitals shown include unfiled device data.  Last Pain:  Vitals:   08/31/23 0801  TempSrc: Tympanic  PainSc: Asleep         Complications: No notable events documented.

## 2023-08-31 NOTE — Anesthesia Postprocedure Evaluation (Signed)
Anesthesia Post Note  Patient: Jordan Leach.  Procedure(s) Performed: COLONOSCOPY WITH PROPOFOL POLYPECTOMY  Patient location during evaluation: Endoscopy Anesthesia Type: MAC Level of consciousness: awake and alert Pain management: pain level controlled Vital Signs Assessment: post-procedure vital signs reviewed and stable Respiratory status: spontaneous breathing, nonlabored ventilation and respiratory function stable Cardiovascular status: blood pressure returned to baseline and stable Postop Assessment: no apparent nausea or vomiting Anesthetic complications: no   No notable events documented.   Last Vitals:  Vitals:   08/31/23 0811 08/31/23 0821  BP: 120/73 121/67  Pulse: (!) 52 (!) 51  Resp: 18 17  Temp:    SpO2: 100% 100%    Last Pain:  Vitals:   08/31/23 0821  TempSrc:   PainSc: 0-No pain                 Foye Deer

## 2023-08-31 NOTE — Interval H&P Note (Signed)
History and Physical Interval Note: Preprocedure H&P from 08/31/23  was reviewed and there was no interval change after seeing and examining the patient.  Written consent was obtained from the patient after discussion of risks, benefits, and alternatives. Patient has consented to proceed with Colonoscopy with possible intervention   08/31/2023 7:34 AM  Jordan Leach.  has presented today for surgery, with the diagnosis of V76.51 (ICD-9-CM) - Z12.11 (ICD-10-CM) - Colon cancer screening.  The various methods of treatment have been discussed with the patient and family. After consideration of risks, benefits and other options for treatment, the patient has consented to  Procedure(s): COLONOSCOPY WITH PROPOFOL (N/A) as a surgical intervention.  The patient's history has been reviewed, patient examined, no change in status, stable for surgery.  I have reviewed the patient's chart and labs.  Questions were answered to the patient's satisfaction.     Jaynie Collins

## 2023-08-31 NOTE — Op Note (Signed)
[facility] Patient Name: Jordan Leach Procedure Date: 08/31/2023 7:09 AM MRN: [id number] Account #: [id number] Date of Birth: October 20, 1950 Admit Type: Outpatient Age: 73 Room: [facility] ENDO ROOM 1 Gender: Male Note Status: Finalized Instrument Name: Colonoscope [id number] Procedure:             Colonoscopy Indications:           Screening for colorectal malignant neoplasm Providers:             Trenda Moots, DO Referring MD:          Marya Amsler. Dareen Piano MD, MD (Referring MD) Medicines:             Monitored Anesthesia Care Complications:         No immediate complications. Estimated blood loss:                         Minimal. Procedure:             Pre-Anesthesia Assessment:                        - Prior to the procedure, a History and Physical was                         performed, and patient medications and allergies were                         reviewed. The patient is competent. The risks and                         benefits of the procedure and the sedation options and                         risks were discussed with the patient. All questions                         were answered and informed consent was obtained.                         Patient identification and proposed procedure were                         verified by the physician, the nurse, the                         anesthesiologist and the technician in the endoscopy                         suite. Mental Status Examination: alert and oriented.                         Airway Examination: normal oropharyngeal airway and                         neck mobility. Respiratory Examination: clear to                         auscultation. CV Examination: RRR, no murmurs, no S3  or S4. Prophylactic Antibiotics: The patient does not                         require prophylactic antibiotics. Prior                         Anticoagulants: The patient has taken no  anticoagulant                         or antiplatelet agents. ASA Grade Assessment: II - A                         patient with mild systemic disease. After reviewing                         the risks and benefits, the patient was deemed in                         satisfactory condition to undergo the procedure. The                         anesthesia plan was to use monitored anesthesia care                         (MAC). Immediately prior to administration of                         medications, the patient was re-assessed for adequacy                         to receive sedatives. The heart rate, respiratory                         rate, oxygen saturations, blood pressure, adequacy of                         pulmonary ventilation, and response to care were                         monitored throughout the procedure. The physical                         status of the patient was re-assessed after the                         procedure.                        After obtaining informed consent, the colonoscope was                         passed under direct vision. Throughout the procedure,                         the patient's blood pressure, pulse, and oxygen                         saturations were monitored continuously. The  Colonoscope was introduced through the anus and                         advanced to the the terminal ileum, with                         identification of the appendiceal orifice and IC                         valve. The colonoscopy was performed without                         difficulty. The patient tolerated the procedure well.                         The quality of the bowel preparation was evaluated                         using the BBPS [facility] Bowel Preparation Scale) with                         scores of: Right Colon = 3, Transverse Colon = 3 and                         Left Colon = 3 (entire mucosa seen well with no                          residual staining, small fragments of stool or opaque                         liquid). The total BBPS score equals 9. The terminal                         ileum, ileocecal valve, appendiceal orifice, and                         rectum were photographed. Findings:      The perianal and digital rectal examinations were normal. Pertinent       negatives include normal sphincter tone.      The terminal ileum appeared normal. Estimated blood loss: none.      Retroflexion in the right colon was performed.      Multiple small-mouthed diverticula were found in the sigmoid colon.       Estimated blood loss: none.      A 1 mm polyp was found in the sigmoid colon. The polyp was sessile. The       polyp was removed with a jumbo cold forceps. Resection and retrieval       were complete. Estimated blood loss was minimal.      Non-bleeding internal hemorrhoids were found during retroflexion. The       hemorrhoids were Grade II (internal hemorrhoids that prolapse but reduce       spontaneously). Estimated blood loss: none.      The exam was otherwise without abnormality on direct and retroflexion       views. Impression:            - The examined portion of the ileum was normal.                        -  Diverticulosis in the sigmoid colon.                        - One 1 mm polyp in the sigmoid colon, removed with a                         jumbo cold forceps. Resected and retrieved.                        - Non-bleeding internal hemorrhoids.                        - The examination was otherwise normal on direct and                         retroflexion views. Recommendation:        - Patient has a contact number available for                         emergencies. The signs and symptoms of potential                         delayed complications were discussed with the patient.                         Return to normal activities tomorrow. Written                         discharge instructions were  provided to the patient.                        - Discharge patient to home.                        - Resume previous diet.                        - Continue present medications.                        - Await pathology results.                        - Repeat colonoscopy for surveillance based on                         pathology results.                        - Return to referring physician as previously                         scheduled.                        - The findings and recommendations were discussed with                         the patient. Procedure Code(s):     --- Professional ---  84132, Colonoscopy, flexible; with biopsy, single or                         multiple Diagnosis Code(s):     --- Professional ---                        Z12.11, Encounter for screening for malignant neoplasm                         of colon                        K64.1, Second degree hemorrhoids                        D12.5, Benign neoplasm of sigmoid colon                        K57.30, Diverticulosis of large intestine without                         perforation or abscess without bleeding CPT copyright 2022 [employer] Association. All rights reserved. The codes documented in this report are preliminary and upon coder review may  be revised to meet current compliance requirements. Attending Participation:      I personally performed the entire procedure. Elfredia Nevins, DO Jaynie Collins DO, DO 08/31/2023 8:02:27 AM This report has been signed electronically. Number of Addenda: 0 Note Initiated On: 08/31/2023 7:09 AM Scope Withdrawal Time: 0 hours 11 minutes 17 seconds  Total Procedure Duration: 0 hours 14 minutes 9 seconds  Estimated Blood Loss:  Estimated blood loss was minimal.      [facility]

## 2023-08-31 NOTE — Anesthesia Preprocedure Evaluation (Addendum)
Anesthesia Evaluation  Patient identified by MRN, date of birth, ID band Patient awake    Reviewed: Allergy & Precautions, H&P , NPO status , Patient's Chart, lab work & pertinent test results, reviewed documented beta blocker date and time   Airway Mallampati: II  TM Distance: >3 FB Neck ROM: full    Dental no notable dental hx.    Pulmonary neg pulmonary ROS   Pulmonary exam normal        Cardiovascular hypertension, Pt. on home beta blockers + Peripheral Vascular Disease (Carotid stenosis, asymptomatic, right)  Normal cardiovascular exam     Neuro/Psych negative neurological ROS  negative psych ROS   GI/Hepatic negative GI ROS, Neg liver ROS,,,  Endo/Other  negative endocrine ROS    Renal/GU Renal InsufficiencyRenal disease  negative genitourinary   Musculoskeletal   Abdominal Normal abdominal exam  (+)   Peds  Hematology negative hematology ROS (+)   Anesthesia Other Findings Past Medical History: 10/21/2019: Actinic keratosis     Comment:  above the R earlobe adjacent to the sup anti tragus (bx               proven) No date: Carotid artery disease (HCC) No date: Chronic kidney disease 03/07/2018: Dysplastic nevus     Comment:  L post shoulder, mod with halo effect No date: Hyperlipidemia No date: Hypertension No date: Peripheral vascular disease (HCC)  Past Surgical History: No date: COLONOSCOPY No date: WISDOM TOOTH EXTRACTION  BMI    Body Mass Index: 23.95 kg/m      Reproductive/Obstetrics negative OB ROS                             Anesthesia Physical Anesthesia Plan  ASA: 2  Anesthesia Plan: General   Post-op Pain Management:    Induction: Intravenous  PONV Risk Score and Plan: Propofol infusion and TIVA  Airway Management Planned: Natural Airway and Nasal Cannula  Additional Equipment:   Intra-op Plan:   Post-operative Plan:   Informed Consent: I  have reviewed the patients History and Physical, chart, labs and discussed the procedure including the risks, benefits and alternatives for the proposed anesthesia with the patient or authorized representative who has indicated his/her understanding and acceptance.     Dental Advisory Given  Plan Discussed with: CRNA and Surgeon  Anesthesia Plan Comments:         Anesthesia Quick Evaluation

## 2023-09-01 ENCOUNTER — Encounter: Payer: Self-pay | Admitting: Gastroenterology

## 2023-09-01 LAB — SURGICAL PATHOLOGY

## 2023-09-11 ENCOUNTER — Ambulatory Visit: Payer: Medicare Other | Admitting: Podiatry

## 2023-09-11 ENCOUNTER — Encounter: Payer: Self-pay | Admitting: Podiatry

## 2023-09-11 DIAGNOSIS — D689 Coagulation defect, unspecified: Secondary | ICD-10-CM

## 2023-09-11 DIAGNOSIS — B351 Tinea unguium: Secondary | ICD-10-CM

## 2023-09-11 DIAGNOSIS — M79676 Pain in unspecified toe(s): Secondary | ICD-10-CM | POA: Diagnosis not present

## 2023-09-11 NOTE — Progress Notes (Signed)
He presents today concerned about the hallux nails and second nails bilaterally.  Objective: These nails are thick yellow dystrophic and incurvated.  Assessment: Painful dystrophic nails.  Plan: Debridement of 4 nails.

## 2024-01-02 ENCOUNTER — Encounter (INDEPENDENT_AMBULATORY_CARE_PROVIDER_SITE_OTHER): Payer: Self-pay

## 2024-04-24 ENCOUNTER — Ambulatory Visit (INDEPENDENT_AMBULATORY_CARE_PROVIDER_SITE_OTHER): Payer: PPO | Admitting: Dermatology

## 2024-04-24 ENCOUNTER — Encounter: Payer: Self-pay | Admitting: Dermatology

## 2024-04-24 DIAGNOSIS — Z7189 Other specified counseling: Secondary | ICD-10-CM

## 2024-04-24 DIAGNOSIS — W908XXA Exposure to other nonionizing radiation, initial encounter: Secondary | ICD-10-CM | POA: Diagnosis not present

## 2024-04-24 DIAGNOSIS — L814 Other melanin hyperpigmentation: Secondary | ICD-10-CM

## 2024-04-24 DIAGNOSIS — Z86018 Personal history of other benign neoplasm: Secondary | ICD-10-CM

## 2024-04-24 DIAGNOSIS — Z1283 Encounter for screening for malignant neoplasm of skin: Secondary | ICD-10-CM | POA: Diagnosis not present

## 2024-04-24 DIAGNOSIS — D485 Neoplasm of uncertain behavior of skin: Secondary | ICD-10-CM

## 2024-04-24 DIAGNOSIS — L821 Other seborrheic keratosis: Secondary | ICD-10-CM | POA: Diagnosis not present

## 2024-04-24 DIAGNOSIS — L603 Nail dystrophy: Secondary | ICD-10-CM

## 2024-04-24 DIAGNOSIS — L820 Inflamed seborrheic keratosis: Secondary | ICD-10-CM

## 2024-04-24 DIAGNOSIS — L578 Other skin changes due to chronic exposure to nonionizing radiation: Secondary | ICD-10-CM | POA: Diagnosis not present

## 2024-04-24 DIAGNOSIS — D229 Melanocytic nevi, unspecified: Secondary | ICD-10-CM

## 2024-04-24 DIAGNOSIS — D1801 Hemangioma of skin and subcutaneous tissue: Secondary | ICD-10-CM

## 2024-04-24 DIAGNOSIS — L719 Rosacea, unspecified: Secondary | ICD-10-CM

## 2024-04-24 NOTE — Patient Instructions (Addendum)
 Recheck small dark macule on the right middle back near the spine, if not resolved in two months return to clinic for biopsy.       Wound Care Instructions  Cleanse wound gently with soap and water once a day then pat dry with clean gauze. Apply a thin coat of Petrolatum (petroleum jelly, Vaseline) over the wound (unless you have an allergy to this). We recommend that you use a new, sterile tube of Vaseline. Do not pick or remove scabs. Do not remove the yellow or white healing tissue from the base of the wound.  Cover the wound with fresh, clean, nonstick gauze and secure with paper tape. You may use Band-Aids in place of gauze and tape if the wound is small enough, but would recommend trimming much of the tape off as there is often too much. Sometimes Band-Aids can irritate the skin.  You should call the office for your biopsy report after 1 week if you have not already been contacted.  If you experience any problems, such as abnormal amounts of bleeding, swelling, significant bruising, significant pain, or evidence of infection, please call the office immediately.  FOR ADULT SURGERY PATIENTS: If you need something for pain relief you may take 1 extra strength Tylenol (acetaminophen) AND 2 Ibuprofen (200mg  each) together every 4 hours as needed for pain. (do not take these if you are allergic to them or if you have a reason you should not take them.) Typically, you may only need pain medication for 1 to 3 days.      Due to recent changes in healthcare laws, you may see results of your pathology and/or laboratory studies on MyChart before the doctors have had a chance to review them. We understand that in some cases there may be results that are confusing or concerning to you. Please understand that not all results are received at the same time and often the doctors may need to interpret multiple results in order to provide you with the best plan of care or course of treatment. Therefore,  we ask that you please give us  2 business days to thoroughly review all your results before contacting the office for clarification. Should we see a critical lab result, you will be contacted sooner.   If You Need Anything After Your Visit  If you have any questions or concerns for your doctor, please call our main line at [phone] and press option 4 to reach your doctor's medical assistant. If no one answers, please leave a voicemail as directed and we will return your call as soon as possible. Messages left after 4 pm will be answered the following business day.   You may also send us  a message via MyChart. We typically respond to MyChart messages within 1-2 business days.  For prescription refills, please ask your pharmacy to contact our office. Our fax number is [phone].  If you have an urgent issue when the clinic is closed that cannot wait until the next business day, you can page your doctor at the number below.    Please note that while we do our best to be available for urgent issues outside of office hours, we are not available 24/7.   If you have an urgent issue and are unable to reach us , you may choose to seek medical care at your doctor's office, retail clinic, urgent care center, or emergency room.  If you have a medical emergency, please immediately call 911 or go to the emergency department.  Pager Numbers  - Dr. Hester: [phone]  - Dr. Jackquline: [phone]  - Dr. Claudene: [phone]   - Dr. Raymund: [phone]  In the event of inclement weather, please call our main line at [phone] for an update on the status of any delays or closures.  Dermatology Medication Tips: Please keep the boxes that topical medications come in in order to help keep track of the instructions about where and how to use these. Pharmacies typically print the medication instructions only on the boxes and not directly on the medication tubes.   If your medication is too  expensive, please contact our office at [phone] option 4 or send us  a message through MyChart.   We are unable to tell what your co-pay for medications will be in advance as this is different depending on your insurance coverage. However, we may be able to find a substitute medication at lower cost or fill out paperwork to get insurance to cover a needed medication.   If a prior authorization is required to get your medication covered by your insurance company, please allow us  1-2 business days to complete this process.  Drug prices often vary depending on where the prescription is filled and some pharmacies may offer cheaper prices.  The website www.goodrx.com contains coupons for medications through different pharmacies. The prices here do not account for what the cost may be with help from insurance (it may be cheaper with your insurance), but the website can give you the price if you did not use any insurance.  - You can print the associated coupon and take it with your prescription to the pharmacy.  - You may also stop by our office during regular business hours and pick up a GoodRx coupon card.  - If you need your prescription sent electronically to a different pharmacy, notify our office through [facility] or by phone at [phone] option 4.     Si Usted Necesita Algo Despus de Su Visita  Tambin puede enviarnos un mensaje a travs de Clinical cytogeneticist. Por lo general respondemos a los mensajes de MyChart en el transcurso de 1 a 2 das hbiles.  Para renovar recetas, por favor pida a su farmacia que se ponga en contacto con nuestra oficina. Randi lakes de fax es [place] [phone].  Si tiene un asunto urgente cuando la clnica est cerrada y que no puede esperar hasta el siguiente da hbil, puede llamar/localizar a su doctor(a) al nmero que aparece a continuacin.   Por favor, tenga en cuenta que aunque hacemos todo lo posible para estar disponibles para asuntos urgentes fuera  del horario de [place], no estamos disponibles las 24 horas del da, los 7 [address]  [address] de la [place].   Si tiene un problema urgente y no puede comunicarse con nosotros, puede optar por buscar atencin mdica  en el consultorio de su doctor(a), en una clnica privada, en un centro de atencin urgente o en una sala de emergencias.  Si tiene Engineer, drilling, por favor llame inmediatamente al 911 o vaya a la sala de emergencias.  Nmeros de bper  - Dr. Hester: [phone]  - Dra. Jackquline: [phone]  - Dr. Claudene: [phone]  - Dra. Kitts: [phone]  En caso de inclemencias del [place], por favor llame a nuestra lnea principal al [phone] para una actualizacin sobre el estado de cualquier retraso o cierre.  Consejos para la medicacin en dermatologa: Por favor, guarde las cajas en las que vienen los medicamentos de uso tpico para ayudarle a seguir  las instrucciones sobre dnde y cmo usarlos. Las farmacias generalmente imprimen las instrucciones del medicamento slo en las cajas y no directamente en los tubos del [place].   Si su medicamento es muy caro, por favor, pngase en contacto con landry rieger llamando al [phone] y presione la opcin 4 o envenos un mensaje a travs de Clinical cytogeneticist.   No podemos decirle cul ser su copago por los medicamentos por adelantado ya que esto es diferente dependiendo de la cobertura de su seguro. Sin embargo, es posible que podamos encontrar un medicamento sustituto a Audiological scientist un formulario para que el seguro cubra el medicamento que se considera necesario.   Si se requiere una autorizacin previa para que su compaa de seguros malta su medicamento, por favor permtanos de 1 a 2 das hbiles para completar este proceso.  Los precios de los medicamentos varan con frecuencia dependiendo del Environmental consultant de dnde se surte la receta y alguna farmacias pueden ofrecer precios ms baratos.  El sitio web www.goodrx.com tiene cupones  para medicamentos de Health and safety inspector. Los precios aqu no tienen en cuenta lo que podra costar con la ayuda del seguro (puede ser ms barato con su seguro), pero el sitio web puede darle el precio si no utiliz Tourist information centre manager.  - Puede imprimir el cupn correspondiente y llevarlo con su receta a la farmacia.  - Tambin puede pasar por nuestra oficina durante el horario de atencin regular y Education officer, museum una tarjeta de cupones de GoodRx.  - Si necesita que su receta se enve electrnicamente a una farmacia diferente, informe a nuestra oficina a travs de MyChart de [place] o por telfono llamando al [phone] y presione la opcin 4.

## 2024-04-24 NOTE — Progress Notes (Signed)
 Follow-Up Visit   Subjective  Jordan Leach. is a 73 y.o. male who presents for the following: Skin Cancer Screening and Full Body Skin Exam  The patient presents for Total-Body Skin Exam (TBSE) for skin cancer screening and mole check. The patient has spots, moles and lesions to be evaluated, some may be new or changing and the patient may have concern these could be cancer.  The following portions of the chart were reviewed this encounter and updated as appropriate: medications, allergies, medical history  Review of Systems:  No other skin or systemic complaints except as noted in HPI or Assessment and Plan.  Objective  Well appearing patient in no apparent distress; mood and affect are within normal limits.  A full examination was performed including scalp, head, eyes, ears, nose, lips, neck, chest, axillae, abdomen, back, buttocks, bilateral upper extremities, bilateral lower extremities, hands, feet, fingers, toes, fingernails, and toenails. All findings within normal limits unless otherwise noted below.   Relevant physical exam findings are noted in the Assessment and Plan.  R lat distal deltoid 0.2 cm irregular dark brown macule.  R mid back at spine Erythematous stuck-on, waxy papule or plaque   Assessment & Plan   SKIN CANCER SCREENING PERFORMED TODAY.  ACTINIC DAMAGE - Chronic condition, secondary to cumulative UV/sun exposure - diffuse scaly erythematous macules with underlying dyspigmentation - Recommend daily broad spectrum sunscreen SPF 30+ to sun-exposed areas, reapply every 2 hours as needed.  - Staying in the shade or wearing long sleeves, sun glasses (UVA+UVB protection) and wide brim hats (4-inch brim around the entire circumference of the hat) are also recommended for sun protection.  - Call for new or changing lesions.  LENTIGINES, SEBORRHEIC KERATOSES, HEMANGIOMAS - Benign normal skin lesions - Benign-appearing - Call for any  changes  MELANOCYTIC NEVI - Tan-brown and/or pink-flesh-colored symmetric macules and papules - Benign appearing on exam today - Observation - Call clinic for new or changing moles - Recommend daily use of broad spectrum spf 30+ sunscreen to sun-exposed areas.   HISTORY OF DYSPLASTIC NEVI No evidence of recurrence today Recommend regular full body skin exams Recommend daily broad spectrum sunscreen SPF 30+ to sun-exposed areas, reapply every 2 hours as needed.  Call if any new or changing lesions are noted between office visits  ROSACEA Exam Mid face erythema with telangiectasias Rosacea is a chronic progressive skin condition usually affecting the face of adults, causing redness and/or acne bumps. It is treatable but not curable. It sometimes affects the eyes (ocular rosacea) as well. It may respond to topical and/or systemic medication and can flare with stress, sun exposure, alcohol, exercise, topical steroids (including hydrocortisone/cortisone 10) and some foods.  Daily application of broad spectrum spf 30+ sunscreen to face is recommended to reduce flares. Treatment Plan Counseling for BBL / IPL / Laser and Coordination of Care Discussed the treatment option of Broad Band Light (BBL) /Intense Pulsed Light (IPL)/ Laser for skin discoloration, including brown spots and redness.  Typically we recommend at least 1-3 treatment sessions about 5-8 weeks apart for best results.  Cannot have tanned skin when BBL performed, and regular use of sunscreen/photoprotection is advised after the procedure to help maintain results. The patient's condition may also require maintenance treatments in the future.  The fee for BBL / laser treatments is $350 per treatment session for the whole face.  A fee can be quoted for other parts of the body.  Insurance typically does not pay for BBL/laser  treatments and therefore the fee is an out-of-pocket cost. Recommend prophylactic valtrex treatment. Once scheduled  for procedure, will send Rx in prior to patient's appointment.   NAIL PROBLEM Exam: dystrophic nail Treatment Plan: Being followed by Dr. Verta, culture negative for tinea unguium, likely due to trauma from nail rubbing against shoe.   NEOPLASM OF UNCERTAIN BEHAVIOR OF SKIN R lat distal deltoid Epidermal / dermal shaving  Lesion diameter (cm):  0.2 Informed consent: discussed and consent obtained   Timeout: patient name, date of birth, surgical site, and procedure verified   Procedure prep:  Patient was prepped and draped in usual sterile fashion Prep type:  Isopropyl alcohol Anesthesia: the lesion was anesthetized in a standard fashion   Anesthetic:  1% lidocaine  w/ epinephrine 1-100,000 buffered w/ 8.4% NaHCO3 Instrument used: flexible razor blade   Hemostasis achieved with: pressure, aluminum chloride and electrodesiccation   Outcome: patient tolerated procedure well   Post-procedure details: sterile dressing applied and wound care instructions given   Dressing type: bandage (Mupirocin 2% ointment)    Specimen 1 - Surgical pathology Differential Diagnosis: D48.5 r/o dysplastic nevus Check Margins: Yes INFLAMED SEBORRHEIC KERATOSIS R mid back at spine Symptomatic, irritating, patient would like treated. Wife to check area and if present after two months RTC for bx.  Destruction of lesion - R mid back at spine Complexity: simple   Destruction method: cryotherapy   Informed consent: discussed and consent obtained   Timeout:  patient name, date of birth, surgical site, and procedure verified Lesion destroyed using liquid nitrogen: Yes   Region frozen until ice ball extended beyond lesion: Yes   Outcome: patient tolerated procedure well with no complications   Post-procedure details: wound care instructions given     Return in about 1 year (around 04/24/2025) for TBSE - hx dysplastic nevi, [place].  I, Rosina Mayans, CMA, am acting as scribe for Alm Rhyme, MD  .   Documentation: I have reviewed the above documentation for accuracy and completeness, and I agree with the above.  Alm Rhyme, MD

## 2024-04-26 LAB — SURGICAL PATHOLOGY

## 2024-04-29 ENCOUNTER — Ambulatory Visit: Payer: Self-pay | Admitting: Dermatology

## 2024-04-29 NOTE — Telephone Encounter (Signed)
-----   Message from Alm Rhyme sent at 04/29/2024  5:27 PM EDT ----- FINAL DIAGNOSIS        1. Skin, R lat distal deltoid :       LENTIGO    Benign Lentigo (=freckle) No further treatment needed ----- Message ----- From: Interface, Lab In Three Zero One Sent: 04/26/2024   4:32 PM EDT To: Alm JAYSON Rhyme, MD

## 2025-04-30 ENCOUNTER — Ambulatory Visit: Admitting: Dermatology
